# Patient Record
Sex: Female | Born: 1995 | Race: Black or African American | Hispanic: No | Marital: Single | Smoking: Former smoker
Health system: Southern US, Community
[De-identification: ages and names within clinical notes are randomized; demographics above are authoritative.]

## PROBLEM LIST (undated history)

## (undated) DIAGNOSIS — I10 Essential (primary) hypertension: Secondary | ICD-10-CM

## (undated) DIAGNOSIS — O1410 Severe pre-eclampsia, unspecified trimester: Secondary | ICD-10-CM

## (undated) DIAGNOSIS — R7989 Other specified abnormal findings of blood chemistry: Secondary | ICD-10-CM

## (undated) HISTORY — DX: Severe pre-eclampsia, unspecified trimester: O14.10

---

## 2017-08-29 ENCOUNTER — Encounter: Payer: Self-pay | Admitting: Emergency Medicine

## 2017-08-29 ENCOUNTER — Emergency Department
Admission: EM | Admit: 2017-08-29 | Discharge: 2017-08-29 | Disposition: A | Discharge status: Home / Self Care | Payer: Medicaid Other | Attending: Emergency Medicine | Admitting: Emergency Medicine

## 2017-08-29 DIAGNOSIS — Z3A00 Weeks of gestation of pregnancy not specified: Secondary | ICD-10-CM | POA: Diagnosis not present

## 2017-08-29 DIAGNOSIS — O219 Vomiting of pregnancy, unspecified: Secondary | ICD-10-CM | POA: Diagnosis not present

## 2017-08-29 DIAGNOSIS — Z3201 Encounter for pregnancy test, result positive: Secondary | ICD-10-CM | POA: Diagnosis not present

## 2017-08-29 DIAGNOSIS — R112 Nausea with vomiting, unspecified: Secondary | ICD-10-CM | POA: Diagnosis present

## 2017-08-29 LAB — URINALYSIS, ROUTINE W REFLEX MICROSCOPIC
BACTERIA UA: NONE SEEN
Bilirubin Urine: NEGATIVE
GLUCOSE, UA: NEGATIVE mg/dL
HGB URINE DIPSTICK: NEGATIVE
KETONES UR: NEGATIVE mg/dL
Leukocytes, UA: NEGATIVE
Nitrite: NEGATIVE
PROTEIN: 30 mg/dL — AB
Specific Gravity, Urine: 1.026 (ref 1.005–1.030)
pH: 5 (ref 5.0–8.0)

## 2017-08-29 LAB — POCT PREGNANCY, URINE: Preg Test, Ur: POSITIVE — AB

## 2017-08-29 MED ORDER — ONDANSETRON 4 MG PO TBDP: 4.0000 mg | ORAL_TABLET | Freq: Three times a day (TID) | ORAL | 0 refills | Status: DC | PRN

## 2017-08-29 MED ORDER — SODIUM CHLORIDE 0.9 % IV BOLUS (SEPSIS)
1000.0000 mL | Freq: Once | INTRAVENOUS | Status: DC
Start: 2017-08-29 — End: 2017-08-29

## 2017-08-29 MED ORDER — PRENATAL 1 30-0.975-200 MG PO CAPS: 30.0000 | ORAL_CAPSULE | Freq: Every day | ORAL | 2 refills | Status: AC

## 2017-08-29 NOTE — ED Provider Notes (Signed)
[facility] Emergency Department Provider Note  Time seen: 2:02 PM  I have reviewed the triage vital signs and the nursing notes.   HISTORY  Chief Complaint Emesis    HPI Tamara Krause is a 21 y.o. female Patient presents to the emergency department for intermittent nausea and vomiting over the past 3 weeks. Patient also states her last period was July 18. Denies any abdominal pain, vaginal bleeding or discharge. States mild nausea currently.  History reviewed. No pertinent past medical history.  There are no active problems to display for this patient.   History reviewed. No pertinent surgical history.  Prior to Admission medications   Not on File    No Known Allergies  No family history on file.  Social History Social History  Substance Use Topics  . Smoking status: Never Smoker  . Smokeless tobacco: Never Used  . Alcohol use No    Review of Systems Constitutional: Negative for fever. Cardiovascular: Negative for chest pain. Respiratory: Negative for shortness of breath. Gastrointestinal: Negative for abdominal pain Genitourinary: Negative for dysuria.negative for vaginal bleeding or discharge. Musculoskeletal: Negative for back pain. All other ROS negative  ____________________________________________   PHYSICAL EXAM:  VITAL SIGNS: ED Triage Vitals  Enc Vitals Group     BP 08/29/17 1310 (!) 145/97     Pulse Rate 08/29/17 1310 (!) 102     Resp 08/29/17 1310 16     Temp 08/29/17 1310 97.9 F (36.6 C)     Temp Source 08/29/17 1310 Oral     SpO2 08/29/17 1310 97 %     Weight 08/29/17 1312 200 lb (90.7 kg)     Height --      Head Circumference --      Peak Flow --      Pain Score --      Pain Loc --      Pain Edu? --      Excl. in GC? --     Constitutional: Alert and oriented. Well appearing and in no distress. Eyes: Normal exam ENT   Head: Normocephalic and atraumatic.   Mouth/Throat: Mucous membranes are  moist. Cardiovascular: Normal rate, regular rhythm. No murmur Respiratory: Normal respiratory effort without tachypnea nor retractions. Breath sounds are clear  Gastrointestinal: Soft and nontender. No distention.   Neurologic:  Normal speech and language. No gross focal neurologic deficits Skin:  Skin is warm, dry and intact.  Psychiatric: Mood and affect are normal.   ____________________________________________   INITIAL IMPRESSION / ASSESSMENT AND PLAN / ED COURSE  Pertinent labs & imaging results that were available during my care of the patient were reviewed by me and considered in my medical decision making (see chart for details).  patient's urine pregnancy test is positive. Urinalysis otherwise negative. Patient has no abdominal pain, nontender exam. I discussed with the patient follow-up with an OB/GYN. Given her significant nausea we will write for Zofran. I discussion with the patient regarding cleft lip/cleft palate risk, she still wishes to use the medication.  overall the patient appears very well. We will prescribe a prenatal vitamin. I discussed with the patient basic prenatal care such as over-the-counter medications.  ____________________________________________   FINAL CLINICAL IMPRESSION(S) / ED DIAGNOSES  pregnancy    Minna AntisPaduchowski, Mordche Hedglin, MD 08/29/17 [phone]

## 2017-08-29 NOTE — ED Triage Notes (Signed)
Pt to ED c/o N/V x 3 days. Pt states that she has vomited 6 times in the last 24 hours. Pt is drinking water in triage.

## 2017-08-29 NOTE — ED Notes (Signed)
FIRST NURSE NOTE: Pt c/o feeling very sleepy and vomiting. Pt arrived via POV.

## 2018-05-05 ENCOUNTER — Emergency Department
Admission: EM | Admit: 2018-05-05 | Discharge: 2018-05-05 | Disposition: A | Discharge status: Home / Self Care | Payer: Medicaid Other | Attending: Emergency Medicine | Admitting: Emergency Medicine

## 2018-05-05 ENCOUNTER — Other Ambulatory Visit: Payer: Self-pay

## 2018-05-05 ENCOUNTER — Encounter: Payer: Self-pay | Admitting: Emergency Medicine

## 2018-05-05 DIAGNOSIS — Z3490 Encounter for supervision of normal pregnancy, unspecified, unspecified trimester: Secondary | ICD-10-CM

## 2018-05-05 DIAGNOSIS — Z3201 Encounter for pregnancy test, result positive: Secondary | ICD-10-CM | POA: Insufficient documentation

## 2018-05-05 DIAGNOSIS — Z3A00 Weeks of gestation of pregnancy not specified: Secondary | ICD-10-CM | POA: Insufficient documentation

## 2018-05-05 DIAGNOSIS — Z87891 Personal history of nicotine dependence: Secondary | ICD-10-CM | POA: Diagnosis not present

## 2018-05-05 DIAGNOSIS — O210 Mild hyperemesis gravidarum: Secondary | ICD-10-CM | POA: Insufficient documentation

## 2018-05-05 DIAGNOSIS — O219 Vomiting of pregnancy, unspecified: Secondary | ICD-10-CM | POA: Diagnosis present

## 2018-05-05 LAB — URINALYSIS, COMPLETE (UACMP) WITH MICROSCOPIC
Bacteria, UA: NONE SEEN
Glucose, UA: NEGATIVE mg/dL
HGB URINE DIPSTICK: NEGATIVE
Ketones, ur: 20 mg/dL — AB
Nitrite: NEGATIVE
PH: 8 (ref 5.0–8.0)
Protein, ur: 100 mg/dL — AB
SPECIFIC GRAVITY, URINE: 1.029 (ref 1.005–1.030)

## 2018-05-05 LAB — COMPREHENSIVE METABOLIC PANEL
ALK PHOS: 40 U/L (ref 38–126)
ALT: 32 U/L (ref 14–54)
AST: 23 U/L (ref 15–41)
Albumin: 3.7 g/dL (ref 3.5–5.0)
Anion gap: 5 (ref 5–15)
BILIRUBIN TOTAL: 0.6 mg/dL (ref 0.3–1.2)
CALCIUM: 8.8 mg/dL — AB (ref 8.9–10.3)
CHLORIDE: 104 mmol/L (ref 101–111)
CO2: 26 mmol/L (ref 22–32)
CREATININE: 0.57 mg/dL (ref 0.44–1.00)
Glucose, Bld: 90 mg/dL (ref 65–99)
Potassium: 2.9 mmol/L — ABNORMAL LOW (ref 3.5–5.1)
Sodium: 135 mmol/L (ref 135–145)
TOTAL PROTEIN: 7 g/dL (ref 6.5–8.1)

## 2018-05-05 LAB — CBC
HCT: 34.6 % — ABNORMAL LOW (ref 35.0–47.0)
Hemoglobin: 12.1 g/dL (ref 12.0–16.0)
MCH: 27.7 pg (ref 26.0–34.0)
MCHC: 34.9 g/dL (ref 32.0–36.0)
MCV: 79.6 fL — AB (ref 80.0–100.0)
PLATELETS: 286 10*3/uL (ref 150–440)
RBC: 4.35 MIL/uL (ref 3.80–5.20)
RDW: 14.3 % (ref 11.5–14.5)
WBC: 4.7 10*3/uL (ref 3.6–11.0)

## 2018-05-05 LAB — LIPASE, BLOOD: Lipase: 32 U/L (ref 11–51)

## 2018-05-05 LAB — POCT PREGNANCY, URINE: Preg Test, Ur: POSITIVE — AB

## 2018-05-05 MED ORDER — METOCLOPRAMIDE HCL 10 MG PO TABS: 10.0000 mg | ORAL_TABLET | Freq: Three times a day (TID) | ORAL | 0 refills | Status: DC | PRN

## 2018-05-05 NOTE — ED Notes (Signed)
Patient denies pain and is resting comfortably.  

## 2018-05-05 NOTE — ED Triage Notes (Signed)
Pt in via POV with complaints of N/V x approximately 3 weeks.  Pt denies any abdominal pain, denies diarrhea.  Vitals WDL, NAD noted at this time.

## 2018-05-05 NOTE — Discharge Instructions (Signed)
Please seek medical attention for any high fevers, chest pain, shortness of breath, change in behavior, persistent vomiting, bloody stool or any other new or concerning symptoms.  

## 2018-05-05 NOTE — ED Provider Notes (Signed)
[facility] Emergency Department Provider Note  ____________________________________________   I have reviewed the triage vital signs and the nursing notes.   HISTORY  Chief Complaint Emesis   History limited by: Not Limited   HPI Tamara Krause is a 22 y.o. female who presents to the emergency department today because of concerns for nausea and vomiting.  She states this been going on for the past few weeks.  Happens in the morning.  She wonders if she is pregnant.  When asked if they done a pregnancy test the partner states that they had and it was positive however she appears to deny this.  Either way she has not started prenatal vitamins.  She denies any abdominal pain.  Denies any vaginal bleeding.   Per medical record review patient has a history of vaginal bleeding in the passed and missed abortion in the past.   History reviewed. No pertinent past medical history.  There are no active problems to display for this patient.   History reviewed. No pertinent surgical history.  Prior to Admission medications   Medication Sig Start Date End Date Taking? Authorizing Provider  ondansetron (ZOFRAN ODT) 4 MG disintegrating tablet Take 1 tablet (4 mg total) by mouth every 8 (eight) hours as needed for nausea or vomiting. 08/29/17   Minna Antis, MD    Allergies Patient has no known allergies.  No family history on file.  Social History Social History   Tobacco Use  . Smoking status: Former Games developer  . Smokeless tobacco: Never Used  Substance Use Topics  . Alcohol use: No  . Drug use: No    Review of Systems Constitutional: No fever/chills Eyes: No visual changes. ENT: No sore throat. Cardiovascular: Denies chest pain. Respiratory: Denies shortness of breath. Gastrointestinal: No abdominal pain.  Positive for nausea and vomiting. Genitourinary: Negative for dysuria. Musculoskeletal: Negative for back pain. Skin: Negative for  rash. Neurological: Negative for headaches, focal weakness or numbness.  ____________________________________________   PHYSICAL EXAM:  VITAL SIGNS: ED Triage Vitals  Enc Vitals Group     BP 05/05/18 1423 127/65     Pulse Rate 05/05/18 1423 90     Resp 05/05/18 1423 16     Temp 05/05/18 1423 98.8 F (37.1 C)     Temp Source 05/05/18 1423 Oral     SpO2 05/05/18 1423 100 %     Weight 05/05/18 1424 180 lb (81.6 kg)     Height 05/05/18 1424  (1.626 m)     Head Circumference --      Peak Flow --      Pain Score 05/05/18 1424 0   Constitutional: Alert and oriented. Well appearing and in no distress. Eyes: Conjunctivae are normal.  ENT   Head: Normocephalic and atraumatic.   Nose: No congestion/rhinnorhea.   Mouth/Throat: Mucous membranes are moist.   Neck: No stridor. Cardiovascular: Normal rate, regular rhythm.  No murmurs, rubs, or gallops.  Respiratory: Normal respiratory effort without tachypnea nor retractions. Breath sounds are clear and equal bilaterally. No wheezes/rales/rhonchi. Gastrointestinal: Soft and non tender. No rebound. No guarding.  Genitourinary: Deferred Musculoskeletal: Normal range of motion in all extremities. No lower extremity edema. Neurologic:  Normal speech and language. No gross focal neurologic deficits are appreciated.  Skin:  Skin is warm, dry and intact. No rash noted. Psychiatric: Mood and affect are normal. Speech and behavior are normal. Patient exhibits appropriate insight and judgment.  ____________________________________________    LABS (pertinent positives/negatives)  Upreg positive  Lipase 32 CMP na 135, k 2.9, glu 90, cr 0.57 CBC wbc 4.7, hgb 12.1, plt 286 UA cloudy, trace leukocytes, 6-10 wbcs, 21-50 squaomous ____________________________________________   EKG  None  ____________________________________________     RADIOLOGY  None  ____________________________________________   PROCEDURES  Procedures  ____________________________________________   INITIAL IMPRESSION / ASSESSMENT AND PLAN / ED COURSE  Pertinent labs & imaging results that were available during my care of the patient were reviewed by me and considered in my medical decision making (see chart for details).  Patient presented to the emergency department today because of concerns for nausea and vomiting possible pregnancy.  Urinary pregnancy test was positive today.  Had a long discussion with the patient about importance of testing if she thinks she is pregnant in the future.  Discussed importance of prenatal vitamins.  Will plan on discharging with antiemetics.  Given lack of abdominal pain, tenderness or vaginal bleeding do not feel any emergent ultrasound is warranted at this time.  Discussed with patient importance of following up with OB/GYN to establish prenatal care.   ____________________________________________   FINAL CLINICAL IMPRESSION(S) / ED DIAGNOSES  Final diagnoses:  Morning sickness  Pregnancy, unspecified gestational age     Note: This dictation was prepared with Nurse, children's dictation. Any transcriptional errors that result from this process are unintentional     Phineas Semen, MD 05/05/18 [phone]

## 2018-05-06 LAB — URINE CULTURE

## 2019-11-17 ENCOUNTER — Encounter: Payer: Self-pay | Admitting: Emergency Medicine

## 2019-11-17 ENCOUNTER — Emergency Department
Admission: EM | Admit: 2019-11-17 | Discharge: 2019-11-17 | Disposition: A | Discharge status: Home / Self Care | Payer: Medicaid Other | Attending: Emergency Medicine | Admitting: Emergency Medicine

## 2019-11-17 ENCOUNTER — Other Ambulatory Visit: Payer: Self-pay

## 2019-11-17 DIAGNOSIS — K029 Dental caries, unspecified: Secondary | ICD-10-CM | POA: Insufficient documentation

## 2019-11-17 DIAGNOSIS — Z87891 Personal history of nicotine dependence: Secondary | ICD-10-CM | POA: Insufficient documentation

## 2019-11-17 DIAGNOSIS — K0889 Other specified disorders of teeth and supporting structures: Secondary | ICD-10-CM | POA: Diagnosis present

## 2019-11-17 DIAGNOSIS — K047 Periapical abscess without sinus: Secondary | ICD-10-CM | POA: Insufficient documentation

## 2019-11-17 MED ORDER — MAGIC MOUTHWASH W/LIDOCAINE: 5.0000 mL | Freq: Four times a day (QID) | ORAL | 0 refills | Status: DC

## 2019-11-17 MED ORDER — AMOXICILLIN 875 MG PO TABS: 875.0000 mg | ORAL_TABLET | Freq: Two times a day (BID) | ORAL | 0 refills | Status: DC

## 2019-11-17 NOTE — ED Triage Notes (Signed)
First RN Note: Pt presents to ED via POV with c/o dental pain, pt states initially one sided now hurts on both sides, and radiates into her ears. Pt A&O x4, NAD noted at this time.

## 2019-11-17 NOTE — Discharge Instructions (Signed)
OPTIONS FOR DENTAL FOLLOW UP CARE ° °[place] Department of Health and Human Services - Local Safety Net Dental Clinics °http://www.ncdhhs.gov/dph/oralhealth/services/safetynetclinics.htm °  °[facility] (336-562-3123) ° °[facility] [place] ([phone]) ° °[facility] ([phone] ext [phone]) ° °[facility] ([phone]) ° °[facility] ([phone]) °This clinic caters to the indigent population and is on a lottery system. °Location: °[facility], [address], [place] °Clinic Hours: °Wednesdays from 6pm - 9pm, patients seen by a lottery system. °For dates, call or go to www.med.[facility].edu/[facility]/patients/Dental-[facility] °Services: °Cleanings, fillings and simple extractions. °Payment Options: °DENTAL WORK IS FREE OF CHARGE. Bring proof of income or support. °Best way to get seen: °Arrive at 5:15 pm - this is a lottery, NOT first come/first serve, so arriving earlier will not increase your chances of being seen. °  °  °[facility] °[phone] °Select option 1 for emergencies °  °Location: °[facility], [address], [place] °Clinic Hours: °No walk-ins accepted - call the day before to schedule an appointment. °Check in times are 9:30 am and 1:30 pm. °Services: °Simple extractions, temporary fillings, pulpectomy/pulp debridement, uncomplicated abscess drainage. °Payment Options: °PAYMENT IS DUE AT THE TIME OF SERVICE.  Fee is usually $100-200, additional surgical procedures (e.g. abscess drainage) may be extra. °Cash, checks, Visa/MasterCard accepted.  Can file Medicaid if patient is covered for dental - patient should call case worker to check. °No discount for [facility] patients. °Best way to get seen: °MUST call the day before and get onto the schedule. Can usually be seen the next 1-2 days. No walk-ins accepted. °  °  °[facility] °[phone] °   °Location: °[facility], [address], [place] °Clinic Hours: °M, W, Th, F 8am or 1:30pm, Tues 9a or 1:30 - first come/first served. °Services: °Simple extractions, temporary fillings, uncomplicated abscess drainage.  You do not need to be an [place] resident. °Payment Options: °PAYMENT IS DUE AT THE TIME OF SERVICE. °Dental insurance, otherwise sliding scale - bring proof of income or support. °Depending on income and treatment needed, cost is usually $50-200. °Best way to get seen: °Arrive early as it is first come/first served. °  °  °[facility] °[phone] °  °Location: °[address] °Clinic Hours: °Mon-Thu 8a-5p °Services: °Most basic dental services including extractions and fillings. °Payment Options: °PAYMENT IS DUE AT THE TIME OF SERVICE. °Sliding scale, up to 50% off - bring proof if income or support. °Medicaid with dental option accepted. °Best way to get seen: °Call to schedule an appointment, can usually be seen within 2 weeks OR they will try to see walk-ins - show up at 8a or 2p (you may have to wait). °  °  °[facility] °919-245-2435 °[place] RESIDENTS ONLY °  °Location: °[facility], [address], [place],  [address] °Clinic Hours: By appointment only. °Monday - Thursday 8am-5pm, Friday 8am-12pm °Services: Cleanings, fillings, extractions. °Payment Options: °PAYMENT IS DUE AT THE TIME OF SERVICE. °Cash, Visa or MasterCard. Sliding scale - $30 minimum per service. °Best way to get seen: °Come in to office, complete packet and make an appointment - need proof of income °or support monies for each household member and proof of [place] residence. °Usually takes about a month to get in. °  °  °[facility] °[phone] °  °Location: °[address]   [place] °Clinic Hours: Walk-in Urgent Care Dental Services are offered Monday-Friday  mornings only. °The numbers of emergencies accepted daily is limited to the number of °providers available. °Maximum 15 - Mondays, Wednesdays & Thursdays °Maximum 10 - Tuesdays & Fridays °Services: °You do not need to be a [place] resident to be seen for a dental emergency. °Emergencies are defined as pain, swelling, abnormal bleeding, or dental trauma. Walkins will receive x-rays if needed. °NOTE: Dental cleaning is not an emergency. °Payment Options: °PAYMENT IS DUE AT THE TIME OF SERVICE. °Minimum co-pay is $40.00 for uninsured patients. °Minimum co-pay is $3.00 for Medicaid with dental coverage. °Dental Insurance is accepted and must be presented at time of visit. °Medicare does not cover dental. °Forms of payment: Cash, credit card, checks. °Best way to get seen: °If not previously registered with the clinic, walk-in dental registration begins at 7:15 am and is on a first come/first serve basis. °If previously registered with the clinic, call to make an appointment. °  °  °[facility] °[phone] °[place] RESIDENTS ONLY °  °Location: °[address], [place], [place] °Clinic Hours: °Mon-Thu 10a-2p °Services: Extractions only! °Payment Options: °FREE (donations accepted) - bring proof of income or support °Best way to get seen: °Call and schedule an appointment OR come at 8am on the 1st Monday of every month (except for holidays) when it is first come/first served. °  °  °[facility] °[phone] °  °Location: °[address], [place] °Clinic Hours: °Friday mornings °Services, Payment Options, Best way to get seen: °Call for info °

## 2019-11-17 NOTE — ED Notes (Signed)
See triage note presents with dental pain  States she has pain to both lower gumlines  States pain is moving into ears

## 2019-11-17 NOTE — ED Provider Notes (Signed)
[facility] Emergency Department Provider Note  ____________________________________________  Time seen: Approximately 6:02 PM  I have reviewed the triage vital signs and the nursing notes.   HISTORY  Chief Complaint Dental Pain    HPI Tamara Krause is a 23 y.o. female who presents the emergency department for evaluation of bilateral lower dental pain.  Patient reports the pain began on the right side, now encompasses the left side.  Patient has had no reported oropharyngeal swelling.  No fevers or chills, difficulty breathing or swallowing.  Patient reports that sometimes the pain radiates into her ears.  Patient denies any headache, URI symptoms, chest pain, shortness of breath abdominal pain, nausea vomiting.  Patient is tried intermittent Tylenol or Motrin with no significant relief of symptoms.  Patient does not have a dentist.         History reviewed. No pertinent past medical history.  There are no active problems to display for this patient.   History reviewed. No pertinent surgical history.  Prior to Admission medications   Medication Sig Start Date End Date Taking? Authorizing Provider  amoxicillin (AMOXIL) 875 MG tablet Take 1 tablet (875 mg total) by mouth 2 (two) times daily. 11/17/19   Cuthriell, Delorise Royals, PA-C  magic mouthwash w/lidocaine SOLN Take 5 mLs by mouth 4 (four) times daily. 11/17/19   Cuthriell, Delorise Royals, PA-C    Allergies Patient has no known allergies.  No family history on file.  Social History Social History   Tobacco Use  . Smoking status: Former Games developer  . Smokeless tobacco: Never Used  Substance Use Topics  . Alcohol use: No  . Drug use: No     Review of Systems  Constitutional: No fever/chills Eyes: No visual changes. No discharge ENT: Positive for bilateral lower dental pain Cardiovascular: no chest pain. Respiratory: no cough. No SOB. Gastrointestinal: No abdominal pain.  No nausea, no vomiting.   No diarrhea.  No constipation. Musculoskeletal: Negative for musculoskeletal pain. Skin: Negative for rash, abrasions, lacerations, ecchymosis. Neurological: Negative for headaches, focal weakness or numbness. 10-point ROS otherwise negative.  ____________________________________________   PHYSICAL EXAM:  VITAL SIGNS: ED Triage Vitals  Enc Vitals Group     BP 11/17/19 1714 (!) 152/100     Pulse Rate 11/17/19 1714 91     Resp 11/17/19 1714 18     Temp 11/17/19 1714 98.3 F (36.8 C)     Temp Source 11/17/19 1714 Oral     SpO2 11/17/19 1714 100 %     Weight 11/17/19 1723 179 lb 14.3 oz (81.6 kg)     Height --      Head Circumference --      Peak Flow --      Pain Score 11/17/19 1723 8     Pain Loc --      Pain Edu? --      Excl. in GC? --      Constitutional: Alert and oriented. Well appearing and in no acute distress. Eyes: Conjunctivae are normal. PERRL. EOMI. Head: Atraumatic. ENT:      Ears:       Nose: No congestion/rhinnorhea.      Mouth/Throat: Mucous membranes are moist.  Evaluation of the oropharyngeal cavity reveals multiple dental erosions, 2 areas to both lower dentition that have eroded to the gumline.  No gross erythema or edema.  No purulent drainage.  Uvula is midline.  Sublingual space is soft. Neck: No stridor.  Neck is supple full range of motion  Hematological/Lymphatic/Immunilogical: No cervical lymphadenopathy. Cardiovascular: Normal rate, regular rhythm. Normal S1 and S2.  Good peripheral circulation. Respiratory: Normal respiratory effort without tachypnea or retractions. Lungs CTAB. Good air entry to the bases with no decreased or absent breath sounds. Musculoskeletal: Full range of motion to all extremities. No gross deformities appreciated. Neurologic:  Normal speech and language. No gross focal neurologic deficits are appreciated.  Skin:  Skin is warm, dry and intact. No rash noted. Psychiatric: Mood and affect are normal. Speech and behavior are  normal. Patient exhibits appropriate insight and judgement.   ____________________________________________   LABS (all labs ordered are listed, but only abnormal results are displayed)  Labs Reviewed - No data to display ____________________________________________  EKG   ____________________________________________  RADIOLOGY   No results found.  ____________________________________________    PROCEDURES  Procedure(s) performed:    Procedures    Medications - No data to display   ____________________________________________   INITIAL IMPRESSION / ASSESSMENT AND PLAN / ED COURSE  Pertinent labs & imaging results that were available during my care of the patient were reviewed by me and considered in my medical decision making (see chart for details).  Review of the [place] CSRS was performed in accordance of the [place] prior to dispensing any controlled drugs.           Patient's diagnosis is consistent with dental caries, dental pain.  Patient presented to emergency department with bilateral lower dental pain.  Patient has areas to bilateral lower dentition that have eroded to the gumline.  Patient does not have a dentist.  No gross oropharyngeal edema or erythema.  No evidence of deep space infection.. Patient will be discharged home with prescriptions for magic mouthwash and amoxicillin. Patient is to follow up with dentist as needed or otherwise directed. Patient is given ED precautions to return to the ED for any worsening or new symptoms.     ____________________________________________  FINAL CLINICAL IMPRESSION(S) / ED DIAGNOSES  Final diagnoses:  Dental caries  Dental infection  Pain, dental      NEW MEDICATIONS STARTED DURING THIS VISIT:  ED Discharge Orders         Ordered    magic mouthwash w/lidocaine SOLN  4 times daily    Note to Pharmacy: Dispense in a 1/1/1 ratio. Use lidocaine, diphenhydramine, prednisolone   11/17/19 1824     amoxicillin (AMOXIL) 875 MG tablet  2 times daily     11/17/19 1824              This chart was dictated using voice recognition software/Dragon. Despite best efforts to proofread, errors can occur which can change the meaning. Any change was purely unintentional.    Darletta Moll, PA-C 11/17/19 Gertha Calkin, MD 11/17/19 2022

## 2019-11-17 NOTE — ED Triage Notes (Signed)
C/O dental pain x 1 week.  Now c/o facial pain as well.  AAOx3.  Skin warm and dry. NAD

## 2019-12-11 ENCOUNTER — Emergency Department
Admission: EM | Admit: 2019-12-11 | Discharge: 2019-12-11 | Disposition: A | Discharge status: Home / Self Care | Payer: Medicaid Other | Attending: Emergency Medicine | Admitting: Emergency Medicine

## 2019-12-11 ENCOUNTER — Encounter: Payer: Self-pay | Admitting: Emergency Medicine

## 2019-12-11 ENCOUNTER — Other Ambulatory Visit: Payer: Self-pay

## 2019-12-11 ENCOUNTER — Emergency Department: Payer: Medicaid Other

## 2019-12-11 DIAGNOSIS — O211 Hyperemesis gravidarum with metabolic disturbance: Secondary | ICD-10-CM | POA: Insufficient documentation

## 2019-12-11 DIAGNOSIS — O210 Mild hyperemesis gravidarum: Secondary | ICD-10-CM

## 2019-12-11 DIAGNOSIS — Z3A08 8 weeks gestation of pregnancy: Secondary | ICD-10-CM | POA: Diagnosis not present

## 2019-12-11 DIAGNOSIS — R112 Nausea with vomiting, unspecified: Secondary | ICD-10-CM | POA: Diagnosis present

## 2019-12-11 DIAGNOSIS — Z87891 Personal history of nicotine dependence: Secondary | ICD-10-CM | POA: Diagnosis not present

## 2019-12-11 LAB — URINALYSIS, COMPLETE (UACMP) WITH MICROSCOPIC
Bilirubin Urine: NEGATIVE
Glucose, UA: NEGATIVE mg/dL
Hgb urine dipstick: NEGATIVE
Ketones, ur: 20 mg/dL — AB
Nitrite: NEGATIVE
Protein, ur: >=300 mg/dL — AB
Specific Gravity, Urine: 1.028 (ref 1.005–1.030)
pH: 5 (ref 5.0–8.0)

## 2019-12-11 LAB — CBC WITH DIFFERENTIAL/PLATELET
Abs Immature Granulocytes: 0.01 10*3/uL (ref 0.00–0.07)
Basophils Absolute: 0 10*3/uL (ref 0.0–0.1)
Basophils Relative: 1 %
Eosinophils Absolute: 0 10*3/uL (ref 0.0–0.5)
Eosinophils Relative: 0 %
HCT: 40.7 % (ref 36.0–46.0)
Hemoglobin: 15.1 g/dL — ABNORMAL HIGH (ref 12.0–15.0)
Immature Granulocytes: 0 %
Lymphocytes Relative: 47 %
Lymphs Abs: 2.9 10*3/uL (ref 0.7–4.0)
MCH: 27.3 pg (ref 26.0–34.0)
MCHC: 37.1 g/dL — ABNORMAL HIGH (ref 30.0–36.0)
MCV: 73.6 fL — ABNORMAL LOW (ref 80.0–100.0)
Monocytes Absolute: 0.6 10*3/uL (ref 0.1–1.0)
Monocytes Relative: 10 %
Neutro Abs: 2.6 10*3/uL (ref 1.7–7.7)
Neutrophils Relative %: 42 %
Platelets: 270 10*3/uL (ref 150–400)
RBC: 5.53 MIL/uL — ABNORMAL HIGH (ref 3.87–5.11)
RDW: 12.8 % (ref 11.5–15.5)
WBC: 6.2 10*3/uL (ref 4.0–10.5)
nRBC: 0 % (ref 0.0–0.2)

## 2019-12-11 LAB — COMPREHENSIVE METABOLIC PANEL
ALT: 57 U/L — ABNORMAL HIGH (ref 0–44)
AST: 40 U/L (ref 15–41)
Albumin: 5.2 g/dL — ABNORMAL HIGH (ref 3.5–5.0)
Alkaline Phosphatase: 56 U/L (ref 38–126)
Anion gap: 18 — ABNORMAL HIGH (ref 5–15)
BUN: 17 mg/dL (ref 6–20)
CO2: 22 mmol/L (ref 22–32)
Calcium: 10.2 mg/dL (ref 8.9–10.3)
Chloride: 94 mmol/L — ABNORMAL LOW (ref 98–111)
Creatinine, Ser: 0.82 mg/dL (ref 0.44–1.00)
GFR calc Af Amer: >60 mL/min (ref >60)
GFR calc non Af Amer: >60 mL/min (ref >60)
Glucose, Bld: 107 mg/dL — ABNORMAL HIGH (ref 70–99)
Potassium: 3 mmol/L — ABNORMAL LOW (ref 3.5–5.1)
Sodium: 134 mmol/L — ABNORMAL LOW (ref 135–145)
Total Bilirubin: 2.3 mg/dL — ABNORMAL HIGH (ref 0.3–1.2)
Total Protein: 9.5 g/dL — ABNORMAL HIGH (ref 6.5–8.1)

## 2019-12-11 LAB — HCG, QUANTITATIVE, PREGNANCY: hCG, Beta Chain, Quant, S: 146035 m[IU]/mL — ABNORMAL HIGH (ref <5)

## 2019-12-11 LAB — LIPASE, BLOOD: Lipase: 34 U/L (ref 11–51)

## 2019-12-11 LAB — POCT PREGNANCY, URINE: Preg Test, Ur: POSITIVE — AB

## 2019-12-11 MED ORDER — PROMETHAZINE HCL 12.5 MG PO TABS: 12.5000 mg | ORAL_TABLET | Freq: Four times a day (QID) | ORAL | 0 refills | Status: DC | PRN

## 2019-12-11 MED ORDER — LACTATED RINGERS IV BOLUS
1000.0000 mL | Freq: Once | INTRAVENOUS | Status: AC
  Administered 2019-12-11: 1000 mL via INTRAVENOUS

## 2019-12-11 MED ORDER — POTASSIUM CHLORIDE CRYS ER 20 MEQ PO TBCR
40.0000 meq | EXTENDED_RELEASE_TABLET | Freq: Once | ORAL | Status: AC
  Administered 2019-12-11: 40 meq via ORAL
  Filled 2019-12-11: qty 2

## 2019-12-11 MED ORDER — SODIUM CHLORIDE 0.9% FLUSH: 3.0000 mL | Freq: Once | INTRAVENOUS | Status: DC

## 2019-12-11 MED ORDER — SODIUM CHLORIDE 0.9 % IV BOLUS
1000.0000 mL | Freq: Once | INTRAVENOUS | Status: AC
  Administered 2019-12-11: 1000 mL via INTRAVENOUS

## 2019-12-11 MED ORDER — ONDANSETRON HCL 4 MG/2ML IJ SOLN
4.0000 mg | Freq: Once | INTRAMUSCULAR | Status: AC
  Administered 2019-12-11: 4 mg via INTRAVENOUS
  Filled 2019-12-11: qty 2

## 2019-12-11 NOTE — ED Notes (Signed)
Pt requests something to drink. Pt given ice chips.

## 2019-12-11 NOTE — ED Notes (Signed)
Patient transported to Ultrasound 

## 2019-12-11 NOTE — ED Provider Notes (Signed)
[facility] Emergency Department Provider Note   ____________________________________________   First MD Initiated Contact with Patient 12/11/19 1442     (approximate)  I have reviewed the triage vital signs and the nursing notes.   HISTORY  Chief Complaint Emesis    HPI Tamara Krause is a 23 y.o. female with no significant past medical history presents to the ED complaining of nausea and vomiting.  Patient reports 1 week of persistent nausea and vomiting, about 4-5 episodes per day.  She states she has been unable to keep down any solids or liquids due to these episodes, but she has not noticed any blood in her vomit.  She denies any diarrhea, abdominal pain, dysuria, or hematuria.  She does state that she had similar difficulties with nausea and vomiting during a prior pregnancy and her LMP was in the middle of October.  She denies any abdominal pain, vaginal bleeding, or vaginal discharge.        History reviewed. No pertinent past medical history.  There are no problems to display for this patient.   Past Surgical History:  Procedure Laterality Date  . CESAREAN SECTION      Prior to Admission medications   Medication Sig Start Date End Date Taking? Authorizing Provider  amoxicillin (AMOXIL) 875 MG tablet Take 1 tablet (875 mg total) by mouth 2 (two) times daily. 11/17/19   Cuthriell, Delorise Royals, PA-C  magic mouthwash w/lidocaine SOLN Take 5 mLs by mouth 4 (four) times daily. 11/17/19   Cuthriell, Delorise Royals, PA-C  promethazine (PHENERGAN) 12.5 MG tablet Take 1 tablet (12.5 mg total) by mouth every 6 (six) hours as needed for nausea or vomiting. 12/11/19   Chesley Noon, MD    Allergies Patient has no known allergies.  No family history on file.  Social History Social History   Tobacco Use  . Smoking status: Former Games developer  . Smokeless tobacco: Never Used  Substance Use Topics  . Alcohol use: No  . Drug use: No    Review of  Systems  Constitutional: No fever/chills Eyes: No visual changes. ENT: No sore throat. Cardiovascular: Denies chest pain. Respiratory: Denies shortness of breath. Gastrointestinal: No abdominal pain.  Positive for nausea and vomiting.  No diarrhea.  No constipation. Genitourinary: Negative for dysuria. Musculoskeletal: Negative for back pain. Skin: Negative for rash. Neurological: Negative for headaches, focal weakness or numbness.  ____________________________________________   PHYSICAL EXAM:  VITAL SIGNS: ED Triage Vitals  Enc Vitals Group     BP 12/11/19 1432 (!) 57/32     Pulse Rate 12/11/19 1432 (!) 125     Resp 12/11/19 1432 16     Temp 12/11/19 1432 99.3 F (37.4 C)     Temp src --      SpO2 12/11/19 1432 99 %     Weight --      Height --      Head Circumference --      Peak Flow --      Pain Score 12/11/19 1433 0     Pain Loc --      Pain Edu? --      Excl. in GC? --     Constitutional: Alert and oriented. Eyes: Conjunctivae are normal. Head: Atraumatic. Nose: No congestion/rhinnorhea. Mouth/Throat: Mucous membranes are moist. Neck: Normal ROM Cardiovascular: Normal rate, regular rhythm. Grossly normal heart sounds. Respiratory: Normal respiratory effort.  No retractions. Lungs CTAB. Gastrointestinal: Soft and nontender. No distention. Genitourinary: deferred Musculoskeletal: No lower extremity tenderness  nor edema. Neurologic:  Normal speech and language. No gross focal neurologic deficits are appreciated. Skin:  Skin is warm, dry and intact. No rash noted. Psychiatric: Mood and affect are normal. Speech and behavior are normal.  ____________________________________________   LABS (all labs ordered are listed, but only abnormal results are displayed)  Labs Reviewed  COMPREHENSIVE METABOLIC PANEL - Abnormal; Notable for the following components:      Result Value   Sodium 134 (*)    Potassium 3.0 (*)    Chloride 94 (*)    Glucose, Bld 107 (*)     Total Protein 9.5 (*)    Albumin 5.2 (*)    ALT 57 (*)    Total Bilirubin 2.3 (*)    Anion gap 18 (*)    All other components within normal limits  URINALYSIS, COMPLETE (UACMP) WITH MICROSCOPIC - Abnormal; Notable for the following components:   Color, Urine AMBER (*)    APPearance CLOUDY (*)    Ketones, ur 20 (*)    Protein, ur >=300 (*)    Leukocytes,Ua SMALL (*)    Bacteria, UA FEW (*)    All other components within normal limits  CBC WITH DIFFERENTIAL/PLATELET - Abnormal; Notable for the following components:   RBC 5.53 (*)    Hemoglobin 15.1 (*)    MCV 73.6 (*)    MCHC 37.1 (*)    All other components within normal limits  HCG, QUANTITATIVE, PREGNANCY - Abnormal; Notable for the following components:   hCG, Beta Chain, Quant, S 146,035 (*)    All other components within normal limits  POCT PREGNANCY, URINE - Abnormal; Notable for the following components:   Preg Test, Ur POSITIVE (*)    All other components within normal limits  LIPASE, BLOOD  POC URINE PREG, ED    PROCEDURES  Procedure(s) performed (including Critical Care):  Procedures   ____________________________________________   INITIAL IMPRESSION / ASSESSMENT AND PLAN / ED COURSE       23 year old female presents to the ED for 1 week of nausea and vomiting with inability to tolerate p.o.,  Reports similar symptoms when she was last pregnant.  She denies any abdominal pain and has no tenderness noted on exam, also denies any vaginal symptoms.  Lab work is significant for hypokalemia as well as mild elevation in bilirubin, will screen right upper quadrant ultrasound for biliary pathology.  Lipase and remainder of LFTs are within normal limits.  Patient also with positive pregnancy testing here in the ED, will need to perform ultrasound to rule out potential ectopic pregnancy.  She does not have any symptoms to suggest UTI.  She reports feeling better following dose of Zofran, will hydrate with IV fluids and  reevaluate following ultrasounds.  Right upper quadrant ultrasound is unremarkable and obstetric ultrasound shows intrauterine pregnancy at approximately 8 weeks of gestation.  Patient without any further nausea or vomiting, has been tolerating p.o. here in the ED.  UA is not consistent with UTI.  Suspect her elevated bilirubin is secondary to vomiting, will have patient follow-up with OB/GYN and return to the ED for new or worsening symptoms.  She was prescribed Phenergan for use at home as this has worked for her previously.      ____________________________________________   FINAL CLINICAL IMPRESSION(S) / ED DIAGNOSES  Final diagnoses:  Nausea and vomiting  Hyperemesis gravidarum  [redacted] weeks gestation of pregnancy     ED Discharge Orders         Ordered  promethazine (PHENERGAN) 12.5 MG tablet  Every 6 hours PRN     12/11/19 1754           Note:  This document was prepared using Dragon voice recognition software and may include unintentional dictation errors.   Chesley NoonJessup, Jakita Dutkiewicz, MD 12/11/19 [phone]

## 2019-12-11 NOTE — ED Triage Notes (Signed)
Pt comes into the ED via EMS from home with c/o N/V for the past week, unsure of pregnancy. Pt also having panic attack.

## 2019-12-11 NOTE — ED Notes (Signed)
Pt given crackers and ginger ale OK per EDP. Pt also given warm blanket.

## 2019-12-11 NOTE — ED Triage Notes (Signed)
Pt to ED via [facility] from home for emesis x 1 week. Pt states that she is unsure if she is pregnant or not. Pt states that she has not been able to keep fluids down. Pt is tachycardiac in triage.

## 2020-01-14 ENCOUNTER — Encounter: Payer: Self-pay | Admitting: Emergency Medicine

## 2020-01-14 ENCOUNTER — Other Ambulatory Visit: Payer: Self-pay

## 2020-01-14 DIAGNOSIS — Z79899 Other long term (current) drug therapy: Secondary | ICD-10-CM | POA: Diagnosis not present

## 2020-01-14 DIAGNOSIS — O219 Vomiting of pregnancy, unspecified: Secondary | ICD-10-CM | POA: Diagnosis present

## 2020-01-14 DIAGNOSIS — O99281 Endocrine, nutritional and metabolic diseases complicating pregnancy, first trimester: Secondary | ICD-10-CM | POA: Insufficient documentation

## 2020-01-14 DIAGNOSIS — O210 Mild hyperemesis gravidarum: Secondary | ICD-10-CM | POA: Diagnosis not present

## 2020-01-14 DIAGNOSIS — Z3A10 10 weeks gestation of pregnancy: Secondary | ICD-10-CM | POA: Insufficient documentation

## 2020-01-14 DIAGNOSIS — Z87891 Personal history of nicotine dependence: Secondary | ICD-10-CM | POA: Diagnosis not present

## 2020-01-14 DIAGNOSIS — E876 Hypokalemia: Secondary | ICD-10-CM | POA: Diagnosis not present

## 2020-01-14 MED ORDER — ONDANSETRON HCL 4 MG/2ML IJ SOLN
4.0000 mg | Freq: Once | INTRAMUSCULAR | Status: AC | PRN
  Administered 2020-01-14: 22:00:00 4 mg via INTRAVENOUS
  Filled 2020-01-14: qty 2

## 2020-01-14 MED ORDER — SODIUM CHLORIDE 0.9 % IV BOLUS
1000.0000 mL | Freq: Once | INTRAVENOUS | Status: AC
  Administered 2020-01-14: 1000 mL via INTRAVENOUS

## 2020-01-14 NOTE — ED Triage Notes (Signed)
Pt is [redacted] weeks pregnant, has had nausea and vomiting for weeks that has worsened for a week. Pt actively vomiting and retching in triage. Denies diarrhea, fever, vaginal bleeding or discharge.

## 2020-01-15 ENCOUNTER — Emergency Department
Admission: EM | Admit: 2020-01-15 | Discharge: 2020-01-15 | Disposition: A | Discharge status: Home / Self Care | Payer: Medicaid Other | Attending: Emergency Medicine | Admitting: Emergency Medicine

## 2020-01-15 DIAGNOSIS — O210 Mild hyperemesis gravidarum: Secondary | ICD-10-CM

## 2020-01-15 DIAGNOSIS — E876 Hypokalemia: Secondary | ICD-10-CM

## 2020-01-15 LAB — CBC
HCT: 36.3 % (ref 36.0–46.0)
Hemoglobin: 13.1 g/dL (ref 12.0–15.0)
MCH: 27.1 pg (ref 26.0–34.0)
MCHC: 36.1 g/dL — ABNORMAL HIGH (ref 30.0–36.0)
MCV: 75.2 fL — ABNORMAL LOW (ref 80.0–100.0)
Platelets: 376 10*3/uL (ref 150–400)
RBC: 4.83 MIL/uL (ref 3.87–5.11)
RDW: 12.5 % (ref 11.5–15.5)
WBC: 6.2 10*3/uL (ref 4.0–10.5)
nRBC: 0 % (ref 0.0–0.2)

## 2020-01-15 LAB — COMPREHENSIVE METABOLIC PANEL
ALT: 49 U/L — ABNORMAL HIGH (ref 0–44)
AST: 30 U/L (ref 15–41)
Albumin: 4.2 g/dL (ref 3.5–5.0)
Alkaline Phosphatase: 56 U/L (ref 38–126)
Anion gap: 15 (ref 5–15)
BUN: 21 mg/dL — ABNORMAL HIGH (ref 6–20)
CO2: 25 mmol/L (ref 22–32)
Calcium: 9.1 mg/dL (ref 8.9–10.3)
Chloride: 93 mmol/L — ABNORMAL LOW (ref 98–111)
Creatinine, Ser: 0.75 mg/dL (ref 0.44–1.00)
GFR calc Af Amer: >60 mL/min (ref >60)
GFR calc non Af Amer: >60 mL/min (ref >60)
Glucose, Bld: 94 mg/dL (ref 70–99)
Potassium: 2.6 mmol/L — CL (ref 3.5–5.1)
Sodium: 133 mmol/L — ABNORMAL LOW (ref 135–145)
Total Bilirubin: 1.9 mg/dL — ABNORMAL HIGH (ref 0.3–1.2)
Total Protein: 8.1 g/dL (ref 6.5–8.1)

## 2020-01-15 LAB — URINALYSIS, COMPLETE (UACMP) WITH MICROSCOPIC
Bilirubin Urine: NEGATIVE
Glucose, UA: NEGATIVE mg/dL
Hgb urine dipstick: NEGATIVE
Ketones, ur: 20 mg/dL — AB
Nitrite: NEGATIVE
Protein, ur: 100 mg/dL — AB
Specific Gravity, Urine: 1.028 (ref 1.005–1.030)
pH: 5 (ref 5.0–8.0)

## 2020-01-15 MED ORDER — METOCLOPRAMIDE HCL 10 MG PO TABS: 10.0000 mg | ORAL_TABLET | Freq: Three times a day (TID) | ORAL | 0 refills | Status: DC | PRN

## 2020-01-15 MED ORDER — POTASSIUM CHLORIDE CRYS ER 20 MEQ PO TBCR
40.0000 meq | EXTENDED_RELEASE_TABLET | Freq: Once | ORAL | Status: AC
  Administered 2020-01-15: 40 meq via ORAL
  Filled 2020-01-15: qty 2

## 2020-01-15 NOTE — ED Notes (Signed)
Pt states she is feeling better and would like to be discharged. Pt informed that we still need to have her lab work resulted and for her to see a MD. Pt verbalizes understanding.

## 2020-01-15 NOTE — ED Provider Notes (Signed)
[facility] Emergency Department Provider Note  ____________________________________________  Time seen: Approximately 12:51 AM  I have reviewed the triage vital signs and the nursing notes.   HISTORY  Chief Complaint Emesis and Nausea   HPI Landon Radloff is a 24 y.o. female G2P1 at  [redacted] weeks GA who presents for evaluation of nausea and vomiting.  Patient has been having daily nausea and vomiting since the beginning of her pregnancy.  Symptoms of gotten worse over the last week.  No abdominal pain, no fever chills, no vaginal bleeding, no dysuria or hematuria, no fever or chills.  She had similar problems with her first pregnancy.  PMH Anemia Varicella  Past Surgical History:  Procedure Laterality Date  . CESAREAN SECTION      Prior to Admission medications   Medication Sig Start Date End Date Taking? Authorizing Provider  amoxicillin (AMOXIL) 875 MG tablet Take 1 tablet (875 mg total) by mouth 2 (two) times daily. 11/17/19   Cuthriell, Delorise Royals, PA-C  magic mouthwash w/lidocaine SOLN Take 5 mLs by mouth 4 (four) times daily. 11/17/19   Cuthriell, Delorise Royals, PA-C  metoCLOPramide (REGLAN) 10 MG tablet Take 1 tablet (10 mg total) by mouth every 8 (eight) hours as needed for up to 3 days for nausea. 01/15/20 01/18/20  Nita Sickle, MD  promethazine (PHENERGAN) 12.5 MG tablet Take 1 tablet (12.5 mg total) by mouth every 6 (six) hours as needed for nausea or vomiting. 12/11/19   Chesley Noon, MD    Allergies Patient has no known allergies.  No family history on file.  Social History Social History   Tobacco Use  . Smoking status: Former Games developer  . Smokeless tobacco: Never Used  Substance Use Topics  . Alcohol use: No  . Drug use: No    Review of Systems  Constitutional: Negative for fever. Eyes: Negative for visual changes. ENT: Negative for sore throat. Neck: No neck pain  Cardiovascular: Negative for chest pain. Respiratory:  Negative for shortness of breath. Gastrointestinal: Negative for abdominal pain, diarrhea. + nausea and vomiting Genitourinary: Negative for dysuria. Musculoskeletal: Negative for back pain. Skin: Negative for rash. Neurological: Negative for headaches, weakness or numbness. Psych: No SI or HI  ____________________________________________   PHYSICAL EXAM:  VITAL SIGNS: Vitals:   01/14/20 2140 01/15/20 0032  BP: (!) 141/86   Pulse: (!) 120 (!) 104  Resp: 20   Temp: 98.4 F (36.9 C)   SpO2: 98%     Constitutional: Alert and oriented. Well appearing and in no apparent distress. HEENT:      Head: Normocephalic and atraumatic.         Eyes: Conjunctivae are normal. Sclera is non-icteric.       Mouth/Throat: Mucous membranes are moist.       Neck: Supple with no signs of meningismus. Cardiovascular: Tachycardic with regular rate and rhythm. No murmurs, gallops, or rubs. 2+ symmetrical distal pulses are present in all extremities. No JVD. Respiratory: Normal respiratory effort. Lungs are clear to auscultation bilaterally. No wheezes, crackles, or rhonchi.  Gastrointestinal: Soft, non tender, and non distended with positive bowel sounds. No rebound or guarding. Musculoskeletal: Nontender with normal range of motion in all extremities. No edema, cyanosis, or erythema of extremities. Neurologic: Normal speech and language. Face is symmetric. Moving all extremities. No gross focal neurologic deficits are appreciated. Skin: Skin is warm, dry and intact. No rash noted. Psychiatric: Mood and affect are normal. Speech and behavior are normal.  ____________________________________________  LABS (all labs ordered are listed, but only abnormal results are displayed)  Labs Reviewed  URINALYSIS, COMPLETE (UACMP) WITH MICROSCOPIC - Abnormal; Notable for the following components:      Result Value   Color, Urine AMBER (*)    APPearance CLOUDY (*)    Ketones, ur 20 (*)    Protein, ur 100  (*)    Leukocytes,Ua TRACE (*)    Bacteria, UA RARE (*)    All other components within normal limits  CBC - Abnormal; Notable for the following components:   MCV 75.2 (*)    MCHC 36.1 (*)    All other components within normal limits  COMPREHENSIVE METABOLIC PANEL - Abnormal; Notable for the following components:   Sodium 133 (*)    Potassium 2.6 (*)    Chloride 93 (*)    BUN 21 (*)    ALT 49 (*)    Total Bilirubin 1.9 (*)    All other components within normal limits   ____________________________________________  EKG  ED ECG REPORT I, Rudene Re, the attending physician, personally viewed and interpreted this ECG.  Normal sinus rhythm, rate of 90, normal intervals, normal axis, no ST elevations or depressions. ____________________________________________  RADIOLOGY  none  ____________________________________________   PROCEDURES  Procedure(s) performed: None Procedures Critical Care performed:  None ____________________________________________   INITIAL IMPRESSION / ASSESSMENT AND PLAN / ED COURSE  24 y.o. female G2P1 at  [redacted] weeks GA who presents for evaluation of nausea and vomiting.  Presentation concerning for hyperemesis.  Labs showing hypokalemia with no EKG changes which was supplemented.  Patient received IV fluids and Zofran with resolution of her symptoms.  She is tolerating p.o.  Bedside ultrasound showing normal IUP with good fetal movement and fetal heart rate of 169.  Abdomen is soft otherwise with no tenderness.  Patient be discharged home on Reglan and close follow-up with OB/GYN.  Discussed my standard return precautions.       As part of my medical decision making, I reviewed the following data within the [place] notes reviewed and incorporated, Labs reviewed , EKG interpreted , Old chart reviewed, Notes from prior ED visits and [place] Controlled Substance Database   Please note:  Patient was evaluated in Emergency  Department today for the symptoms described in the history of present illness. Patient was evaluated in the context of the global COVID-19 pandemic, which necessitated consideration that the patient might be at risk for infection with the SARS-CoV-2 virus that causes COVID-19. Institutional protocols and algorithms that pertain to the evaluation of patients at risk for COVID-19 are in a state of rapid change based on information released by regulatory bodies including the CDC and federal and state organizations. These policies and algorithms were followed during the patient's care in the ED.  Some ED evaluations and interventions may be delayed as a result of limited staffing during the pandemic.   ____________________________________________   FINAL CLINICAL IMPRESSION(S) / ED DIAGNOSES   Final diagnoses:  Hyperemesis gravidarum  Hypokalemia      NEW MEDICATIONS STARTED DURING THIS VISIT:  ED Discharge Orders         Ordered    metoCLOPramide (REGLAN) 10 MG tablet  Every 8 hours PRN     01/15/20 0050           Note:  This document was prepared using Dragon voice recognition software and may include unintentional dictation errors.    Alfred Levins, [place], MD 01/15/20 [phone]

## 2020-01-15 NOTE — ED Notes (Signed)
Date and time results received: 01/15/20 12:25 AM  (use smartphrase ".now" to insert current time)  Test: K+ Critical Value: 2.6  Name of Provider Notified: Paduchowski  Orders Received? Or Actions Taken?: Orders Received - See Orders for details

## 2020-06-13 ENCOUNTER — Other Ambulatory Visit: Payer: Self-pay

## 2020-06-13 ENCOUNTER — Emergency Department
Admission: EM | Admit: 2020-06-13 | Discharge: 2020-06-13 | Disposition: A | Discharge status: Home / Self Care | Payer: Medicaid Other | Attending: Emergency Medicine | Admitting: Emergency Medicine

## 2020-06-13 ENCOUNTER — Emergency Department: Payer: Medicaid Other

## 2020-06-13 DIAGNOSIS — Z3A35 35 weeks gestation of pregnancy: Secondary | ICD-10-CM | POA: Diagnosis not present

## 2020-06-13 DIAGNOSIS — Z5321 Procedure and treatment not carried out due to patient leaving prior to being seen by health care provider: Secondary | ICD-10-CM | POA: Diagnosis not present

## 2020-06-13 DIAGNOSIS — Y9301 Activity, walking, marching and hiking: Secondary | ICD-10-CM | POA: Diagnosis not present

## 2020-06-13 DIAGNOSIS — O9A213 Injury, poisoning and certain other consequences of external causes complicating pregnancy, third trimester: Secondary | ICD-10-CM | POA: Diagnosis not present

## 2020-06-13 DIAGNOSIS — W19XXXA Unspecified fall, initial encounter: Secondary | ICD-10-CM | POA: Diagnosis not present

## 2020-06-13 DIAGNOSIS — S90812A Abrasion, left foot, initial encounter: Secondary | ICD-10-CM | POA: Insufficient documentation

## 2020-06-13 DIAGNOSIS — Y998 Other external cause status: Secondary | ICD-10-CM | POA: Insufficient documentation

## 2020-06-13 DIAGNOSIS — Y9289 Other specified places as the place of occurrence of the external cause: Secondary | ICD-10-CM | POA: Insufficient documentation

## 2020-06-13 NOTE — ED Notes (Signed)
No answer when called several times from lobby 

## 2020-06-13 NOTE — ED Triage Notes (Signed)
Pt to the er for injury r/t a fall. Pt states she was walkibng on stepping stones and believes she stepped in between the stones and foot slid and caused the abrasion to the left medial foot. Pt is 35 weeks. Denies falling on her abd or any problems with the baby at this time.

## 2021-07-06 ENCOUNTER — Other Ambulatory Visit: Payer: Self-pay

## 2021-07-06 ENCOUNTER — Emergency Department
Admission: EM | Admit: 2021-07-06 | Discharge: 2021-07-06 | Disposition: A | Discharge status: Home / Self Care | Payer: Medicaid Other | Attending: Emergency Medicine | Admitting: Emergency Medicine

## 2021-07-06 DIAGNOSIS — Z3A00 Weeks of gestation of pregnancy not specified: Secondary | ICD-10-CM | POA: Insufficient documentation

## 2021-07-06 DIAGNOSIS — Z87891 Personal history of nicotine dependence: Secondary | ICD-10-CM | POA: Insufficient documentation

## 2021-07-06 DIAGNOSIS — O219 Vomiting of pregnancy, unspecified: Secondary | ICD-10-CM | POA: Diagnosis not present

## 2021-07-06 DIAGNOSIS — O10019 Pre-existing essential hypertension complicating pregnancy, unspecified trimester: Secondary | ICD-10-CM | POA: Insufficient documentation

## 2021-07-06 HISTORY — DX: Essential (primary) hypertension: I10

## 2021-07-06 LAB — URINALYSIS, COMPLETE (UACMP) WITH MICROSCOPIC
Bilirubin Urine: NEGATIVE
Glucose, UA: NEGATIVE mg/dL
Hgb urine dipstick: NEGATIVE
Ketones, ur: 80 mg/dL — AB
Nitrite: NEGATIVE
Protein, ur: 100 mg/dL — AB
Specific Gravity, Urine: 1.03 (ref 1.005–1.030)
pH: 5 (ref 5.0–8.0)

## 2021-07-06 LAB — COMPREHENSIVE METABOLIC PANEL
ALT: 16 U/L (ref 0–44)
AST: 21 U/L (ref 15–41)
Albumin: 4.7 g/dL (ref 3.5–5.0)
Alkaline Phosphatase: 49 U/L (ref 38–126)
Anion gap: 15 (ref 5–15)
BUN: 21 mg/dL — ABNORMAL HIGH (ref 6–20)
CO2: 24 mmol/L (ref 22–32)
Calcium: 9.9 mg/dL (ref 8.9–10.3)
Chloride: 95 mmol/L — ABNORMAL LOW (ref 98–111)
Creatinine, Ser: 0.97 mg/dL (ref 0.44–1.00)
GFR, Estimated: >60 mL/min (ref >60)
Glucose, Bld: 100 mg/dL — ABNORMAL HIGH (ref 70–99)
Potassium: 3.2 mmol/L — ABNORMAL LOW (ref 3.5–5.1)
Sodium: 134 mmol/L — ABNORMAL LOW (ref 135–145)
Total Bilirubin: 1.3 mg/dL — ABNORMAL HIGH (ref 0.3–1.2)
Total Protein: 8.8 g/dL — ABNORMAL HIGH (ref 6.5–8.1)

## 2021-07-06 LAB — CBC
HCT: 41.2 % (ref 36.0–46.0)
Hemoglobin: 14.9 g/dL (ref 12.0–15.0)
MCH: 27 pg (ref 26.0–34.0)
MCHC: 36.2 g/dL — ABNORMAL HIGH (ref 30.0–36.0)
MCV: 74.6 fL — ABNORMAL LOW (ref 80.0–100.0)
Platelets: 429 10*3/uL — ABNORMAL HIGH (ref 150–400)
RBC: 5.52 MIL/uL — ABNORMAL HIGH (ref 3.87–5.11)
RDW: 13.2 % (ref 11.5–15.5)
WBC: 5.4 10*3/uL (ref 4.0–10.5)
nRBC: 0 % (ref 0.0–0.2)

## 2021-07-06 LAB — HCG, QUANTITATIVE, PREGNANCY: hCG, Beta Chain, Quant, S: 144964 m[IU]/mL — ABNORMAL HIGH (ref <5)

## 2021-07-06 LAB — LIPASE, BLOOD: Lipase: 34 U/L (ref 11–51)

## 2021-07-06 LAB — POC URINE PREG, ED: Preg Test, Ur: POSITIVE — AB

## 2021-07-06 MED ORDER — DOXYLAMINE-PYRIDOXINE 10-10 MG PO TBEC: 1.0000 | DELAYED_RELEASE_TABLET | Freq: Every day | ORAL | 1 refills | Status: DC

## 2021-07-06 MED ORDER — SODIUM CHLORIDE 0.9 % IV BOLUS
1000.0000 mL | Freq: Once | INTRAVENOUS | Status: AC
  Administered 2021-07-06: 1000 mL via INTRAVENOUS

## 2021-07-06 MED ORDER — METOCLOPRAMIDE HCL 5 MG/ML IJ SOLN
10.0000 mg | Freq: Once | INTRAMUSCULAR | Status: AC
  Administered 2021-07-06: 10 mg via INTRAVENOUS
  Filled 2021-07-06: qty 2

## 2021-07-06 NOTE — ED Provider Notes (Signed)
[facility] Emergency Department Provider Note  ____________________________________________   Event Date/Time   First MD Initiated Contact with Patient 07/06/21 1246     (approximate)  I have reviewed the triage vital signs and the nursing notes.   HISTORY  Chief Complaint Emesis    HPI Brenae Lasecki is a 25 y.o. female presents emergency department with vomiting for 3 to 4 days.  Patient states that the last time this happened she was pregnant.  No idea if she is pregnant at this time.  She denies any burning with urination.  She denies any abdominal pain or vaginal discharge.  She states no cramping or bleeding noted.  Past Medical History:  Diagnosis Date   Hypertension     There are no problems to display for this patient.   Past Surgical History:  Procedure Laterality Date   CESAREAN SECTION      Prior to Admission medications   Medication Sig Start Date End Date Taking? Authorizing Provider  Doxylamine-Pyridoxine 10-10 MG TBEC Take 1 tablet by mouth daily. If after 2 days she continued to have nausea and vomiting, 1 pill in the morning and 1 at night 07/06/21  Yes Zorana Brockwell, Roselyn Bering, PA-C  amoxicillin (AMOXIL) 875 MG tablet Take 1 tablet (875 mg total) by mouth 2 (two) times daily. 11/17/19   Cuthriell, Delorise Royals, PA-C  magic mouthwash w/lidocaine SOLN Take 5 mLs by mouth 4 (four) times daily. 11/17/19   Cuthriell, Delorise Royals, PA-C  metoCLOPramide (REGLAN) 10 MG tablet Take 1 tablet (10 mg total) by mouth every 8 (eight) hours as needed for up to 3 days for nausea. 01/15/20 01/18/20  Nita Sickle, MD  promethazine (PHENERGAN) 12.5 MG tablet Take 1 tablet (12.5 mg total) by mouth every 6 (six) hours as needed for nausea or vomiting. 12/11/19   Chesley Noon, MD    Allergies Patient has no known allergies.  History reviewed. No pertinent family history.  Social History Social History   Tobacco Use   Smoking status: Former    Pack  years: 0.00   Smokeless tobacco: Never  Vaping Use   Vaping Use: Never used  Substance Use Topics   Alcohol use: No   Drug use: No    Review of Systems  Constitutional: No fever/chills Eyes: No visual changes. ENT: No sore throat. Respiratory: Denies cough Cardiovascular: Denies chest pain Gastrointestinal: Denies abdominal pain, positive vomiting Genitourinary: Negative for dysuria. Musculoskeletal: Negative for back pain. Skin: Negative for rash. Psychiatric: no mood changes,     ____________________________________________   PHYSICAL EXAM:  VITAL SIGNS: ED Triage Vitals  Enc Vitals Group     BP 07/06/21 1205 137/80     Pulse Rate 07/06/21 1204 97     Resp 07/06/21 1204 (!) 22     Temp 07/06/21 1204 (!) 97.4 F (36.3 C)     Temp Source 07/06/21 1204 Oral     SpO2 07/06/21 1204 98 %     Weight 07/06/21 1204 200 lb (90.7 kg)     Height 07/06/21 1204 5\' 8"  (1.727 m)     Head Circumference --      Peak Flow --      Pain Score 07/06/21 1204 0     Pain Loc --      Pain Edu? --      Excl. in GC? --     Constitutional: Alert and oriented. Well appearing and in no acute distress. Eyes: Conjunctivae are normal.  Head: Atraumatic.  Nose: No congestion/rhinnorhea. Mouth/Throat: Mucous membranes are moist.   Neck:  supple no lymphadenopathy noted Cardiovascular: Normal rate, regular rhythm. Heart sounds are normal Respiratory: Normal respiratory effort.  No retractions, lungs c t a  Abd: soft nontender bs normal all 4 quad GU: deferred Musculoskeletal: FROM all extremities, warm and well perfused Neurologic:  Normal speech and language.  Skin:  Skin is warm, dry and intact. No rash noted. Psychiatric: Mood and affect are normal. Speech and behavior are normal.  ____________________________________________   LABS (all labs ordered are listed, but only abnormal results are displayed)  Labs Reviewed  COMPREHENSIVE METABOLIC PANEL - Abnormal; Notable for the  following components:      Result Value   Sodium 134 (*)    Potassium 3.2 (*)    Chloride 95 (*)    Glucose, Bld 100 (*)    BUN 21 (*)    Total Protein 8.8 (*)    Total Bilirubin 1.3 (*)    All other components within normal limits  CBC - Abnormal; Notable for the following components:   RBC 5.52 (*)    MCV 74.6 (*)    MCHC 36.2 (*)    Platelets 429 (*)    All other components within normal limits  URINALYSIS, COMPLETE (UACMP) WITH MICROSCOPIC - Abnormal; Notable for the following components:   Color, Urine AMBER (*)    APPearance CLOUDY (*)    Ketones, ur 80 (*)    Protein, ur 100 (*)    Leukocytes,Ua TRACE (*)    Bacteria, UA FEW (*)    All other components within normal limits  HCG, QUANTITATIVE, PREGNANCY - Abnormal; Notable for the following components:   hCG, Beta Chain, Quant, S 144,964 (*)    All other components within normal limits  POC URINE PREG, ED - Abnormal; Notable for the following components:   Preg Test, Ur POSITIVE (*)    All other components within normal limits  LIPASE, BLOOD   ____________________________________________   ____________________________________________  RADIOLOGY    ____________________________________________   PROCEDURES  Procedure(s) performed: No  Procedures    ____________________________________________   INITIAL IMPRESSION / ASSESSMENT AND PLAN / ED COURSE  Pertinent labs & imaging results that were available during my care of the patient were reviewed by me and considered in my medical decision making (see chart for details).   The patient's 25 year old female presents with vomiting.  See HPI.  Physical exam shows patient appears stable at this time  DDx: Gastroenteritis, small bowel obstruction, vomiting in pregnancy, UTI, pyelonephritis  Poc pregnancy is positive CBC is normal, comprehensive metabolic panel has decreased sodium of 134, decreased potassium of 3.2, glucose is 100, BUN is elevated at 21,  urinalysis has 80 ketones, trace of leuks and very few bacteria with hyaline cast.  I do not think the patient has gastroenteritis, she does not have small bowel obstruction, feel this is all vomiting secondary to pregnancy.  She does appear to be little dehydrated.  Will give 1 L normal saline along with Reglan  Beta-hCG is pending   Clinical Course as of 07/06/21 1726  Sat Jul 06, 2021  1511 hCG, quantitative, pregnancy [SF]  1721 HCG, Beta Chain, [place] Longest(!): 119,147 [SF]    Clinical Course User Index [SF] Sherrie Mustache Roselyn Bering, PA-C   I did try to call the patient to notify her of her beta-hCG level.  Left a message for her to return my call.  She was given instructions to follow-up with [facility] or [facility].  Is patient was unsure on whether she wanted to keep the pregnancy or not.  She was discharged in stable condition.  Tamara Krause was evaluated in Emergency Department on 07/06/2021 for the symptoms described in the history of present illness. She was evaluated in the context of the global COVID-19 pandemic, which necessitated consideration that the patient might be at risk for infection with the SARS-CoV-2 virus that causes COVID-19. Institutional protocols and algorithms that pertain to the evaluation of patients at risk for COVID-19 are in a state of rapid change based on information released by regulatory bodies including the CDC and federal and state organizations. These policies and algorithms were followed during the patient's care in the ED.    As part of my medical decision making, I reviewed the following data within the electronic MEDICAL RECORD NUMBER Nursing notes reviewed and incorporated, Labs reviewed , Old chart reviewed, Notes from prior ED visits, and [place] Controlled Substance Database  ____________________________________________   FINAL CLINICAL IMPRESSION(S) / ED DIAGNOSES  Final diagnoses:  Nausea and vomiting in pregnancy       NEW MEDICATIONS STARTED DURING THIS VISIT:  Discharge Medication List as of 07/06/2021  2:20 PM     START taking these medications   Details  Doxylamine-Pyridoxine 10-10 MG TBEC Take 1 tablet by mouth daily. If after 2 days she continued to have nausea and vomiting, 1 pill in the morning and 1 at night, Starting Sat 07/06/2021, Normal         Note:  This document was prepared using Dragon voice recognition software and may include unintentional dictation errors.    Faythe Ghee, PA-C 07/06/21 1726    Phineas Semen, MD 07/07/21 [phone]

## 2021-07-06 NOTE — ED Triage Notes (Signed)
Pt arrives to ER c/o of vomiting x few days. Denies pain. Denies diarrhea, fever. Denies urinary symptoms. Pt is anxious and hyperventilating. Instructed to take slow deep breaths. States hasn't been able to keep BP meds down. Unsure LMP.

## 2021-07-06 NOTE — Discharge Instructions (Addendum)
:  follow up with the [place] [facility] if you plan to continue the pregnancy.  take otc prenatal pills, rx medicine for nausea and vomitingancy,   follow up with one of the follow [facility] clinics if you decide to terminate the pregnancy [address] [place], [place] [address] Hours:  Closed ? Opens 11AM Mon Updated by this business 7 weeks ago Phone: [phone]  [address] [place], [place] [address] Hours:  Closed ? Opens 9AM Tue Phone: [phone]

## 2021-10-31 IMAGING — US US OB COMP LESS 14 WK
1 series · 14 of 28 positions shown · non-contrast
Comparison: None.

CLINICAL DATA: Positive pregnancy test with nausea and vomiting

EXAM:
OBSTETRIC <14 WK ULTRASOUND
TECHNIQUE: Transabdominal ultrasound was performed for evaluation of the
gestation as well as the maternal uterus and adnexal regions.

[Series 1: us ob comp less 14 wk · 14 of 43 slices shown]
[im 2/43]
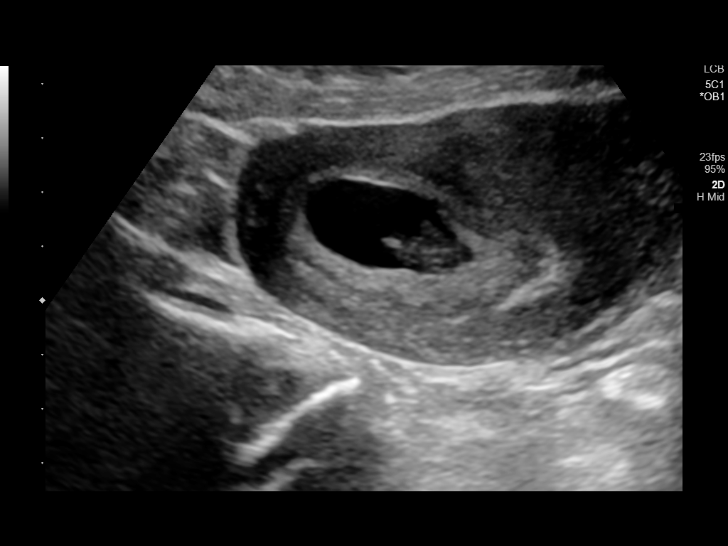
[im 5/43]
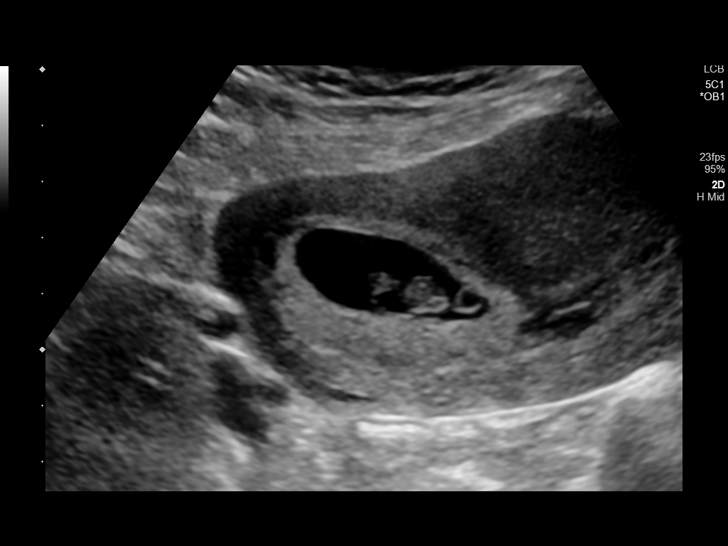
[im 8/43]
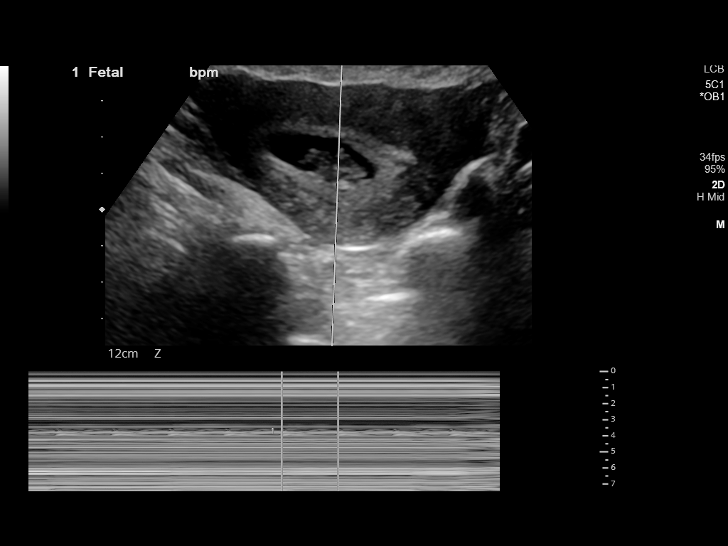
[im 11/43]
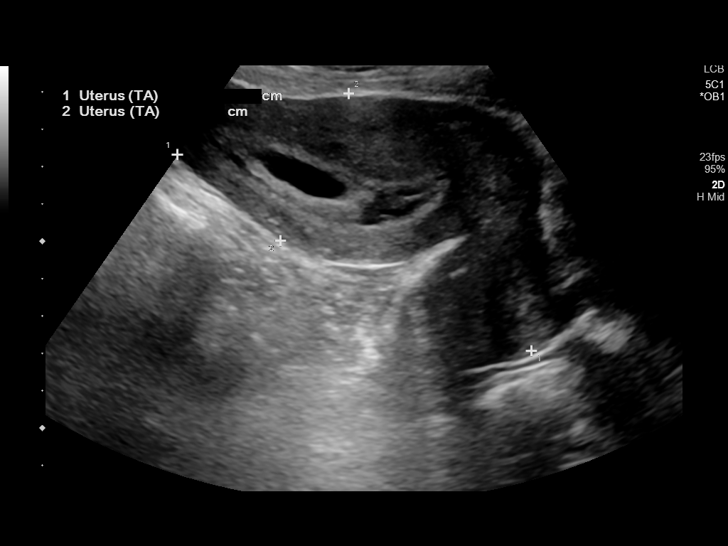
[im 15/43]
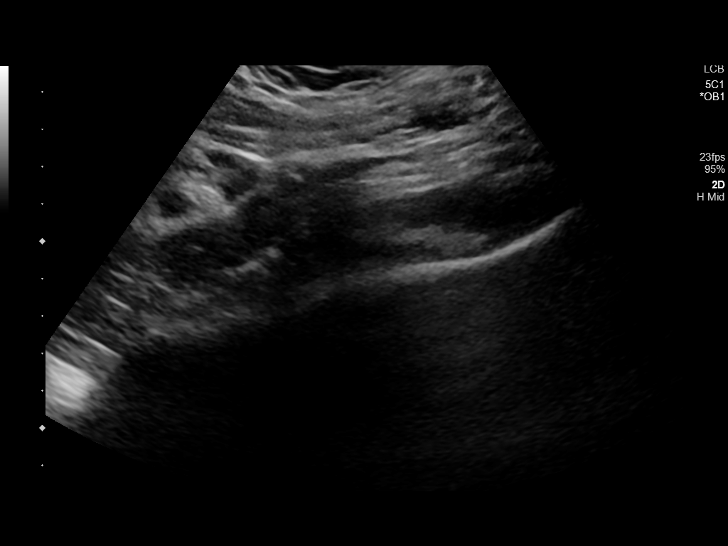
[im 18/43]
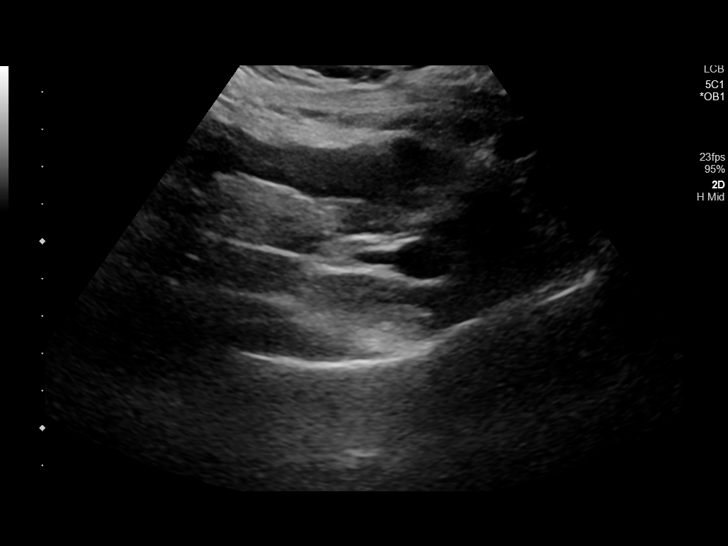
[im 21/43]
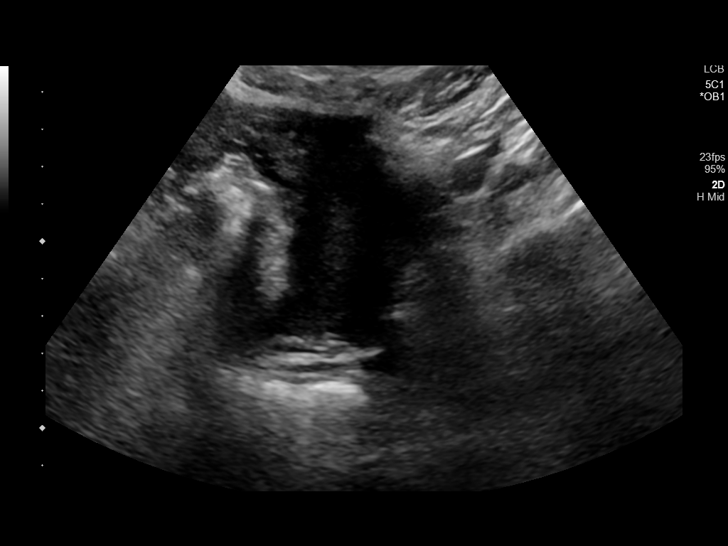
[im 24/43]
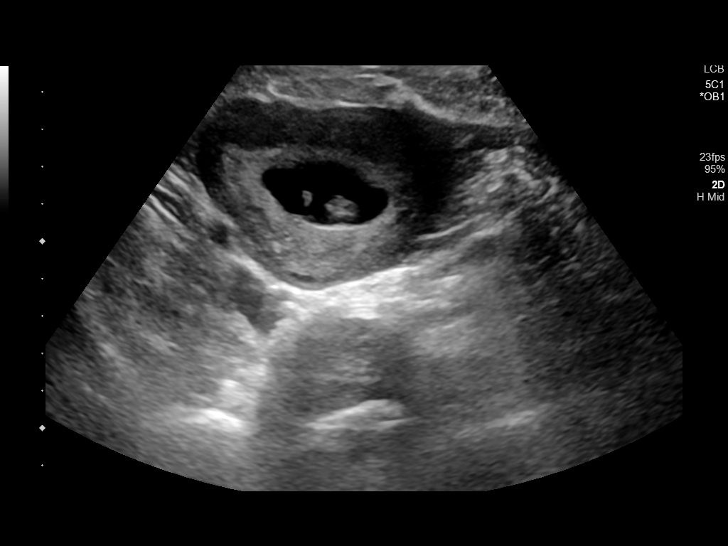
[im 27/43]
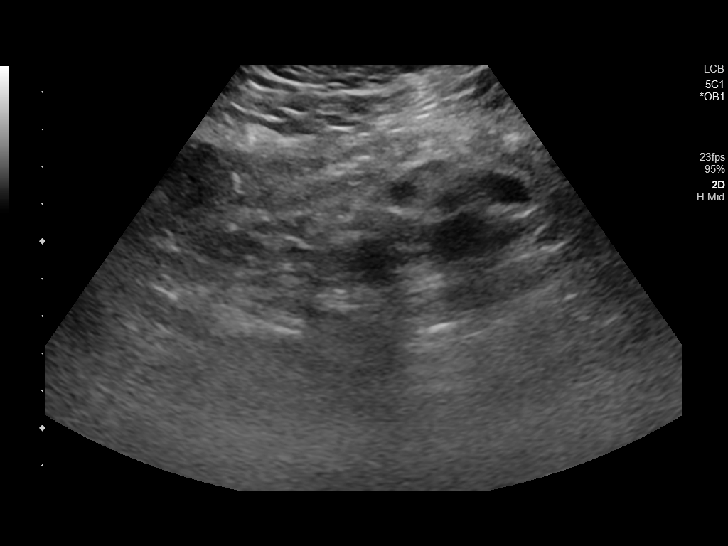
[im 30/43]
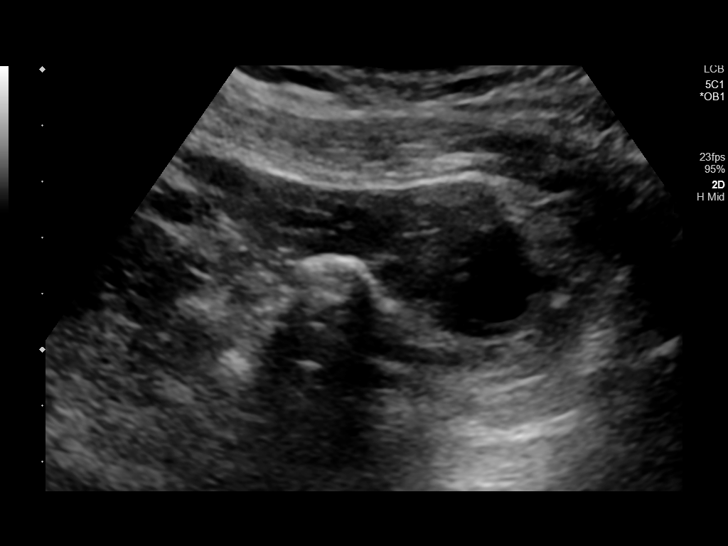
[im 33/43]
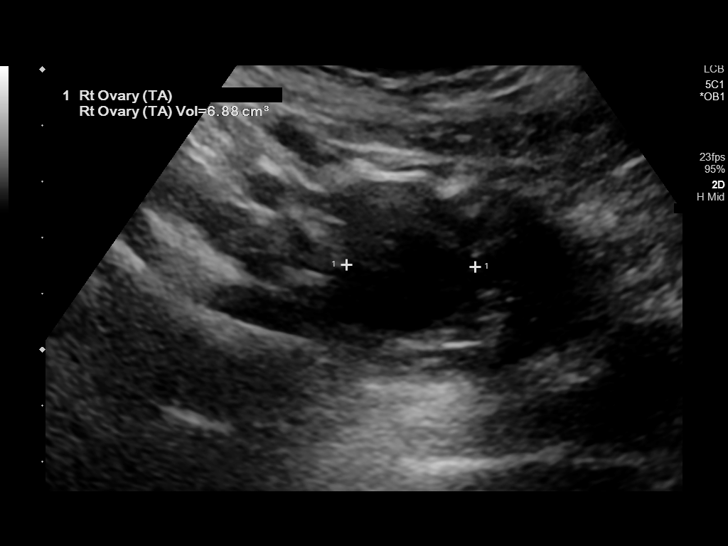
[im 36/43]
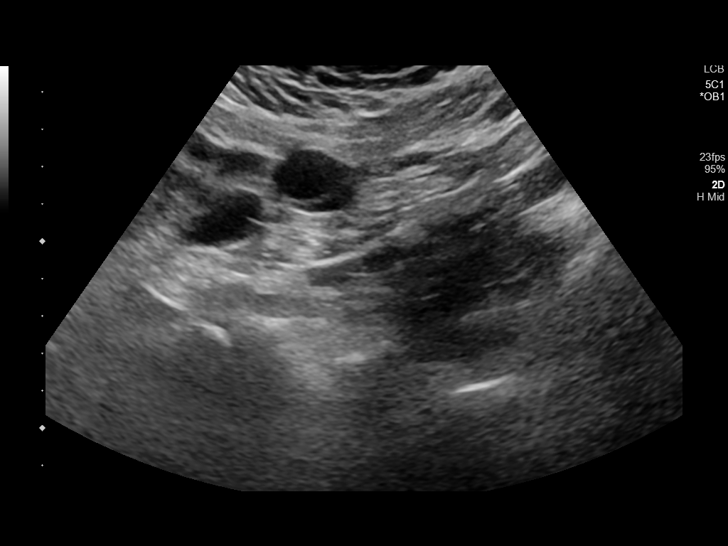
[im 39/43]
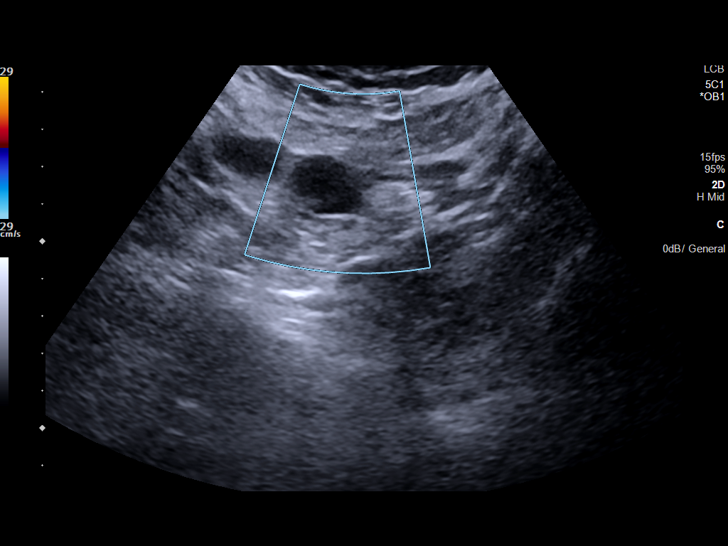
[im 43/43]
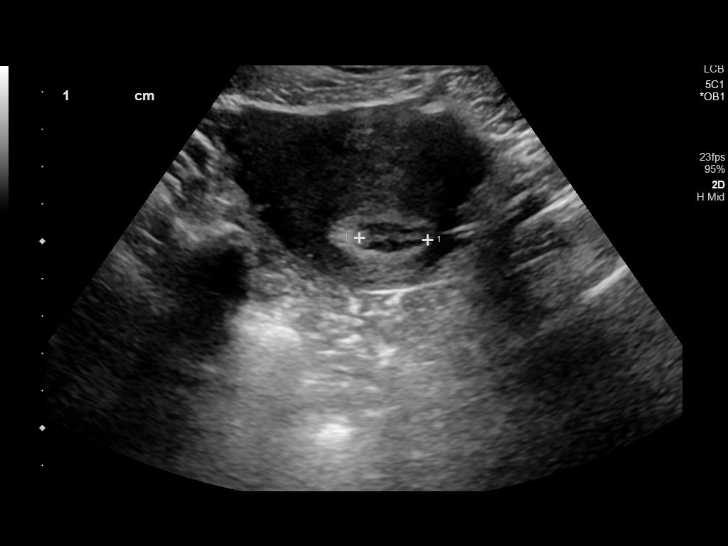

[14 of 28 positions shown; findings below may reference images not displayed]

FINDINGS: Intrauterine gestational sac: Present

Yolk sac:  Present

Embryo:  Present

Cardiac Activity: Present

Heart Rate: 182 bpm

CRL:   21 mm   8 w 5 d                  US EDC: 07/17/2020

Subchorionic hemorrhage: Small area of subchorionic hemorrhage is
noted adjacent to the gestational sac

Maternal uterus/adnexae: Ovaries are within normal limits. No free
fluid is seen.
IMPRESSION: Single live intrauterine gestation at 8 weeks 5 days with small
subchorionic hemorrhage. No other focal abnormality is noted.

## 2021-11-19 ENCOUNTER — Encounter: Payer: Self-pay | Admitting: Emergency Medicine

## 2021-11-19 ENCOUNTER — Other Ambulatory Visit: Payer: Self-pay

## 2021-11-19 ENCOUNTER — Emergency Department
Admission: EM | Admit: 2021-11-19 | Discharge: 2021-11-19 | Disposition: A | Discharge status: Home / Self Care | Payer: Medicaid Other | Attending: Emergency Medicine | Admitting: Emergency Medicine

## 2021-11-19 DIAGNOSIS — Z23 Encounter for immunization: Secondary | ICD-10-CM | POA: Diagnosis not present

## 2021-11-19 DIAGNOSIS — I10 Essential (primary) hypertension: Secondary | ICD-10-CM | POA: Diagnosis not present

## 2021-11-19 DIAGNOSIS — X088XXA Exposure to other specified smoke, fire and flames, initial encounter: Secondary | ICD-10-CM | POA: Insufficient documentation

## 2021-11-19 DIAGNOSIS — T23162A Burn of first degree of back of left hand, initial encounter: Secondary | ICD-10-CM | POA: Diagnosis not present

## 2021-11-19 DIAGNOSIS — O9A212 Injury, poisoning and certain other consequences of external causes complicating pregnancy, second trimester: Secondary | ICD-10-CM | POA: Insufficient documentation

## 2021-11-19 DIAGNOSIS — T310 Burns involving less than 10% of body surface: Secondary | ICD-10-CM | POA: Diagnosis not present

## 2021-11-19 DIAGNOSIS — Z87891 Personal history of nicotine dependence: Secondary | ICD-10-CM | POA: Insufficient documentation

## 2021-11-19 MED ORDER — BACITRACIN 500 UNIT/GM EX OINT: 1.0000 "application " | TOPICAL_OINTMENT | Freq: Two times a day (BID) | CUTANEOUS | 0 refills | Status: DC

## 2021-11-19 MED ORDER — TETANUS-DIPHTH-ACELL PERTUSSIS 5-2.5-18.5 LF-MCG/0.5 IM SUSY
0.5000 mL | PREFILLED_SYRINGE | Freq: Once | INTRAMUSCULAR | Status: AC
  Administered 2021-11-19: 0.5 mL via INTRAMUSCULAR
  Filled 2021-11-19: qty 0.5

## 2021-11-19 MED ORDER — ACETAMINOPHEN 500 MG PO TABS
1000.0000 mg | ORAL_TABLET | Freq: Once | ORAL | Status: AC
  Administered 2021-11-19: 1000 mg via ORAL
  Filled 2021-11-19: qty 2
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
## 2023-09-16 ENCOUNTER — Inpatient Hospital Stay (HOSPITAL_COMMUNITY)
Admission: AD | Admit: 2023-09-16 | Discharge: 2023-09-17 | Disposition: A | Discharge status: Home / Self Care | Payer: Medicaid Other | Attending: Obstetrics and Gynecology | Admitting: Obstetrics and Gynecology

## 2023-09-16 DIAGNOSIS — Z3A01 Less than 8 weeks gestation of pregnancy: Secondary | ICD-10-CM | POA: Insufficient documentation

## 2023-09-16 DIAGNOSIS — O219 Vomiting of pregnancy, unspecified: Secondary | ICD-10-CM | POA: Insufficient documentation

## 2023-09-17 ENCOUNTER — Encounter (HOSPITAL_COMMUNITY): Payer: Self-pay | Admitting: Obstetrics and Gynecology

## 2023-09-17 ENCOUNTER — Other Ambulatory Visit: Payer: Self-pay

## 2023-09-17 DIAGNOSIS — O219 Vomiting of pregnancy, unspecified: Secondary | ICD-10-CM | POA: Diagnosis not present

## 2023-09-17 DIAGNOSIS — Z3A01 Less than 8 weeks gestation of pregnancy: Secondary | ICD-10-CM | POA: Diagnosis not present

## 2023-09-17 DIAGNOSIS — Z3A13 13 weeks gestation of pregnancy: Secondary | ICD-10-CM | POA: Diagnosis not present

## 2023-09-17 DIAGNOSIS — R112 Nausea with vomiting, unspecified: Secondary | ICD-10-CM | POA: Diagnosis present

## 2023-09-17 LAB — COMPREHENSIVE METABOLIC PANEL
ALT: 14 U/L (ref 0–44)
AST: 17 U/L (ref 15–41)
Albumin: 4.3 g/dL (ref 3.5–5.0)
Alkaline Phosphatase: 49 U/L (ref 38–126)
Anion gap: 17 — ABNORMAL HIGH (ref 5–15)
BUN: 15 mg/dL (ref 6–20)
CO2: 20 mmol/L — ABNORMAL LOW (ref 22–32)
Calcium: 9.6 mg/dL (ref 8.9–10.3)
Chloride: 99 mmol/L (ref 98–111)
Creatinine, Ser: 1.03 mg/dL — ABNORMAL HIGH (ref 0.44–1.00)
GFR, Estimated: >60 mL/min (ref >60)
Glucose, Bld: 115 mg/dL — ABNORMAL HIGH (ref 70–99)
Potassium: 3.2 mmol/L — ABNORMAL LOW (ref 3.5–5.1)
Sodium: 136 mmol/L (ref 135–145)
Total Bilirubin: 1.1 mg/dL (ref 0.3–1.2)
Total Protein: 8.6 g/dL — ABNORMAL HIGH (ref 6.5–8.1)

## 2023-09-17 LAB — URINALYSIS, ROUTINE W REFLEX MICROSCOPIC
Glucose, UA: 50 mg/dL — AB
Hgb urine dipstick: NEGATIVE
Ketones, ur: 5 mg/dL — AB
Nitrite: NEGATIVE
Protein, ur: >=300 mg/dL — AB
Specific Gravity, Urine: 1.029 (ref 1.005–1.030)
WBC, UA: >50 WBC/hpf (ref 0–5)
pH: 5 (ref 5.0–8.0)

## 2023-09-17 LAB — LIPASE, BLOOD: Lipase: 26 U/L (ref 11–51)

## 2023-09-17 LAB — CBC
HCT: 36.7 % (ref 36.0–46.0)
Hemoglobin: 13.1 g/dL (ref 12.0–15.0)
MCH: 26.8 pg (ref 26.0–34.0)
MCHC: 35.7 g/dL (ref 30.0–36.0)
MCV: 75.2 fL — ABNORMAL LOW (ref 80.0–100.0)
Platelets: 361 10*3/uL (ref 150–400)
RBC: 4.88 MIL/uL (ref 3.87–5.11)
RDW: 13.6 % (ref 11.5–15.5)
WBC: 6.5 10*3/uL (ref 4.0–10.5)
nRBC: 0 % (ref 0.0–0.2)

## 2023-09-17 LAB — POCT PREGNANCY, URINE: Preg Test, Ur: POSITIVE — AB

## 2023-09-17 MED ORDER — LACTATED RINGERS IV BOLUS
1000.0000 mL | Freq: Once | INTRAVENOUS | Status: AC
  Administered 2023-09-17: 1000 mL via INTRAVENOUS

## 2023-09-17 MED ORDER — ONDANSETRON HCL 4 MG/2ML IJ SOLN
4.0000 mg | Freq: Once | INTRAMUSCULAR | Status: AC
  Administered 2023-09-17: 4 mg via INTRAVENOUS
  Filled 2023-09-17: qty 2

## 2023-09-17 MED ORDER — POTASSIUM CHLORIDE 10 MEQ/100ML IV SOLN
10.0000 meq | INTRAVENOUS | Status: DC
  Filled 2023-09-17 (×3): qty 100

## 2023-09-17 MED ORDER — VITAMIN B-6 25 MG PO TABS: 25.0000 mg | ORAL_TABLET | Freq: Three times a day (TID) | ORAL | 3 refills | Status: DC

## 2023-09-17 MED ORDER — PRENATAL 28-0.8 MG PO TABS
1.0000 | ORAL_TABLET | Freq: Every day | ORAL | 12 refills | Status: AC
Start: 2023-09-17 — End: ?

## 2023-09-17 MED ORDER — POTASSIUM CHLORIDE CRYS ER 20 MEQ PO TBCR
30.0000 meq | EXTENDED_RELEASE_TABLET | Freq: Once | ORAL | Status: AC
  Administered 2023-09-17: 30 meq via ORAL
  Filled 2023-09-17: qty 1

## 2023-09-17 MED ORDER — DOXYLAMINE SUCCINATE (SLEEP) 25 MG PO TABS: 25.0000 mg | ORAL_TABLET | Freq: Every day | ORAL | 2 refills | Status: DC

## 2023-09-17 MED ORDER — ONDANSETRON 4 MG PO TBDP: 4.0000 mg | ORAL_TABLET | Freq: Four times a day (QID) | ORAL | 0 refills | Status: DC | PRN

## 2023-09-17 MED ORDER — POTASSIUM CHLORIDE 20 MEQ PO PACK
40.0000 meq | PACK | Freq: Once | ORAL | Status: DC
  Filled 2023-09-17: qty 2

## 2023-09-17 NOTE — MAU Provider Note (Signed)
History    Chief Complaint  Patient presents with   Emesis   Nausea   27yo [birth year] at [redacted]w[redacted]d by unsure LMP presenting for nausea/vomiting  Pt reports 2 weeks of nausea/vomiting with more than 5 episodes of emesis today. She has not tried any medications for relief. She has not been able to tolerate liquids or solids. Has cough after vomiting but no fevers/chills or shortness of breath. Denies any abdominal pain, VB, or discharge.  Has not yet established care for this pregnancy. Plans to establish care at [facility]  PMH: HTN PSH: Csx2 Meds: antihypertensive - pt unsure if nifedipine or norvasc OB: G1 SAB, G2 term CS, G3 term VBAC, G4 term CS Soc: Denies tobacco, alcohol or drug use    Past Medical History:  Diagnosis Date   Hypertension     Past Surgical History:  Procedure Laterality Date   CESAREAN SECTION      No family history on file.  Social History   Tobacco Use   Smoking status: Former   Smokeless tobacco: Never  Advertising account planner   Vaping status: Never Used  Substance Use Topics   Alcohol use: No   Drug use: No    Allergies: No Known Allergies  Medications Prior to Admission  Medication Sig Dispense Refill Last Dose   amoxicillin (AMOXIL) 875 MG tablet Take 1 tablet (875 mg total) by mouth 2 (two) times daily. 14 tablet 0    bacitracin 500 UNIT/GM ointment Apply 1 application topically 2 (two) times daily. 15 g 0    Doxylamine-Pyridoxine 10-10 MG TBEC Take 1 tablet by mouth daily. If after 2 days she continued to have nausea and vomiting, 1 pill in the morning and 1 at night 60 tablet 1    magic mouthwash w/lidocaine SOLN Take 5 mLs by mouth 4 (four) times daily. 240 mL 0    metoCLOPramide (REGLAN) 10 MG tablet Take 1 tablet (10 mg total) by mouth every 8 (eight) hours as needed for up to 3 days for nausea. 20 tablet 0    promethazine (PHENERGAN) 12.5 MG tablet Take 1 tablet (12.5 mg total) by mouth every 6 (six) hours as needed for nausea or vomiting. 15  tablet 0     Review of Systems  All other systems reviewed and are negative. See HPI  Physical Exam Blood pressure 129/80, pulse 91, temperature 98 F (36.7 C), temperature source Oral, resp. rate 20, height 5\' 4"  (1.626 m), weight 65 kg, last menstrual period 06/13/2023, SpO2 100%, unknown if currently breastfeeding. Physical Exam Constitutional:      General: She is not in acute distress.    Appearance: She is not ill-appearing.  Cardiovascular:     Rate and Rhythm: Normal rate.  Pulmonary:     Effort: Pulmonary effort is normal.  Abdominal:     General: Abdomen is flat. There is no distension.     Tenderness: There is no abdominal tenderness.  Neurological:     Mental Status: She is alert.     MAU Course +UPT CBC unremarkable, no leukocytosis or anemia CMP w/ mild hypokalemia, possible AKI w/ K 3.2, Cr 1.03 Lipase normal UA +ketones, protein, glucose Bedside ultrasound confirms SLIUP with cardiac activity. Reviewed this is a limited ultrasound solely to confirm viable IUP and is not adequate for dating purposes 1L LR & IV zofran 4mg  with symptomatic improvement Tolerated PO intake including PO K supplement  Assessment/Plan: 26yo [birth year] at [redacted]w[redacted]d by unsure LMP presenting for nausea and  vomiting of pregnancy now s/p IVF, antiemetics & K replacement - tolerating PO and ready for discharge home  Discussed B6/unisom and rx for zofran prn Start PNV Pt to establish care with [facility] Return precautions given   Dispo: discharge home  Harvie Bridge, MD Obstetrician & Gynecologist, [facility] for [employer], [facility]

## 2023-09-17 NOTE — MAU Note (Addendum)
.  Tamara Krause is a 27 y.o. at Unknown here in MAU reporting: came in via EMS - N/V for the past couple weeks. Reports emesis >5x. Denies pain, VB, or abnormal discharge.  LMP: June 15 approximate but believes she is 2-[redacted] weeks pregnant.  Pain score: 0 Vitals:   09/17/23 0023  BP: 129/80  Pulse: 91  Resp: 20  Temp: 98 F (36.7 C)  SpO2: 100%     FHT:NA  Lab orders placed from triage: pregnancy urine; UA

## 2023-10-04 ENCOUNTER — Encounter (HOSPITAL_COMMUNITY): Payer: Self-pay

## 2023-10-04 ENCOUNTER — Other Ambulatory Visit: Payer: Self-pay

## 2023-10-04 ENCOUNTER — Inpatient Hospital Stay (HOSPITAL_COMMUNITY)
Admission: AD | Admit: 2023-10-04 | Discharge: 2023-10-04 | Disposition: A | Discharge status: Home / Self Care | Payer: Medicaid Other | Attending: Obstetrics & Gynecology | Admitting: Obstetrics & Gynecology

## 2023-10-04 ENCOUNTER — Encounter (HOSPITAL_COMMUNITY): Payer: Self-pay | Admitting: Obstetrics & Gynecology

## 2023-10-04 ENCOUNTER — Ambulatory Visit (HOSPITAL_COMMUNITY)
Admission: EM | Admit: 2023-10-04 | Discharge: 2023-10-04 | Disposition: A | Discharge status: Home / Self Care | Payer: Medicaid Other

## 2023-10-04 DIAGNOSIS — O219 Vomiting of pregnancy, unspecified: Secondary | ICD-10-CM | POA: Diagnosis present

## 2023-10-04 DIAGNOSIS — O10912 Unspecified pre-existing hypertension complicating pregnancy, second trimester: Secondary | ICD-10-CM | POA: Diagnosis not present

## 2023-10-04 DIAGNOSIS — R7989 Other specified abnormal findings of blood chemistry: Secondary | ICD-10-CM | POA: Diagnosis present

## 2023-10-04 DIAGNOSIS — K117 Disturbances of salivary secretion: Secondary | ICD-10-CM | POA: Diagnosis not present

## 2023-10-04 DIAGNOSIS — O26892 Other specified pregnancy related conditions, second trimester: Secondary | ICD-10-CM | POA: Insufficient documentation

## 2023-10-04 DIAGNOSIS — Z3A16 16 weeks gestation of pregnancy: Secondary | ICD-10-CM | POA: Insufficient documentation

## 2023-10-04 DIAGNOSIS — O26832 Pregnancy related renal disease, second trimester: Secondary | ICD-10-CM | POA: Insufficient documentation

## 2023-10-04 HISTORY — DX: Other specified abnormal findings of blood chemistry: R79.89

## 2023-10-04 LAB — COMPREHENSIVE METABOLIC PANEL
ALT: 22 U/L (ref 0–44)
AST: 18 U/L (ref 15–41)
Albumin: 4.4 g/dL (ref 3.5–5.0)
Alkaline Phosphatase: 54 U/L (ref 38–126)
Anion gap: 12 (ref 5–15)
BUN: 22 mg/dL — ABNORMAL HIGH (ref 6–20)
CO2: 30 mmol/L (ref 22–32)
Calcium: 10 mg/dL (ref 8.9–10.3)
Chloride: 90 mmol/L — ABNORMAL LOW (ref 98–111)
Creatinine, Ser: 1.6 mg/dL — ABNORMAL HIGH (ref 0.44–1.00)
GFR, Estimated: 45 mL/min — ABNORMAL LOW (ref >60)
Glucose, Bld: 116 mg/dL — ABNORMAL HIGH (ref 70–99)
Potassium: 3.3 mmol/L — ABNORMAL LOW (ref 3.5–5.1)
Sodium: 132 mmol/L — ABNORMAL LOW (ref 135–145)
Total Bilirubin: 1 mg/dL (ref 0.3–1.2)
Total Protein: 8.9 g/dL — ABNORMAL HIGH (ref 6.5–8.1)

## 2023-10-04 LAB — CBC
HCT: 38.9 % (ref 36.0–46.0)
Hemoglobin: 14.1 g/dL (ref 12.0–15.0)
MCH: 26.9 pg (ref 26.0–34.0)
MCHC: 36.2 g/dL — ABNORMAL HIGH (ref 30.0–36.0)
MCV: 74.1 fL — ABNORMAL LOW (ref 80.0–100.0)
Platelets: 417 10*3/uL — ABNORMAL HIGH (ref 150–400)
RBC: 5.25 MIL/uL — ABNORMAL HIGH (ref 3.87–5.11)
RDW: 13.2 % (ref 11.5–15.5)
WBC: 5 10*3/uL (ref 4.0–10.5)
nRBC: 0 % (ref 0.0–0.2)

## 2023-10-04 LAB — URINALYSIS, ROUTINE W REFLEX MICROSCOPIC
Glucose, UA: NEGATIVE mg/dL
Hgb urine dipstick: NEGATIVE
Ketones, ur: 5 mg/dL — AB
Nitrite: NEGATIVE
Protein, ur: 100 mg/dL — AB
Specific Gravity, Urine: 1.027 (ref 1.005–1.030)
pH: 5 (ref 5.0–8.0)

## 2023-10-04 MED ORDER — ONDANSETRON 8 MG PO TBDP
4.0000 mg | ORAL_TABLET | Freq: Three times a day (TID) | ORAL | 3 refills | Status: DC | PRN
Start: 2023-10-04 — End: 2023-10-14

## 2023-10-04 MED ORDER — POTASSIUM CHLORIDE 2 MEQ/ML IV SOLN
Freq: Once | INTRAVENOUS | Status: AC
  Filled 2023-10-04: qty 1000

## 2023-10-04 MED ORDER — SCOPOLAMINE 1 MG/3DAYS TD PT72
1.0000 | MEDICATED_PATCH | TRANSDERMAL | Status: DC
  Administered 2023-10-04: 1.5 mg via TRANSDERMAL
  Filled 2023-10-04: qty 1

## 2023-10-04 MED ORDER — POTASSIUM CHLORIDE 2 MEQ/ML IV SOLN
INTRAVENOUS | Status: DC
  Filled 2023-10-04 (×15): qty 1000

## 2023-10-04 MED ORDER — NIFEDIPINE ER OSMOTIC RELEASE 30 MG PO TB24: 30.0000 mg | ORAL_TABLET | Freq: Every day | ORAL | 3 refills | Status: DC

## 2023-10-04 MED ORDER — PROMETHAZINE HCL 25 MG PO TABS
25.0000 mg | ORAL_TABLET | Freq: Four times a day (QID) | ORAL | 2 refills | Status: DC | PRN
Start: 2023-10-04 — End: 2023-10-14

## 2023-10-04 MED ORDER — PROMETHAZINE HCL 25 MG/ML IJ SOLN
25.0000 mg | Freq: Once | INTRAVENOUS | Status: AC
  Administered 2023-10-04: 25 mg via INTRAVENOUS
  Filled 2023-10-04: qty 1

## 2023-10-04 MED ORDER — SCOPOLAMINE 1 MG/3DAYS TD PT72
1.0000 | MEDICATED_PATCH | TRANSDERMAL | 3 refills | Status: DC
Start: 2023-10-04 — End: 2023-10-14

## 2023-10-04 MED ORDER — ONDANSETRON 4 MG PO TBDP
8.0000 mg | ORAL_TABLET | Freq: Once | ORAL | Status: AC
  Administered 2023-10-04: 8 mg via ORAL
  Filled 2023-10-04: qty 2

## 2023-10-04 NOTE — ED Triage Notes (Signed)
Patient's significant other reports that she has been vomiting x 2 days. Patient reports feeling weak and feeling like she is going to pass out.  Patient had a similar episode in September.  Patient's significant other reports that the patient has lost her BP meds. BP-124/110.  Patient was prescribed Zofran, but has not taken.

## 2023-10-04 NOTE — MAU Note (Signed)
Tamara Krause is a 27 y.o. at [redacted]w[redacted]d here in MAU reporting: she's unable to keep anything down for the past few days.  Reports feels weak and light headed.  Denies VB or LOF. LMP: NA Onset of complaint: 3 days Pain score: 3 There were no vitals filed for this visit.   [id number] Lab orders placed from triage:   UA

## 2023-10-04 NOTE — ED Provider Notes (Signed)
About [redacted] weeks pregnant, here with vomiting and weakness x2 days. Seen in the MAU for intractable vomiting on 9/18. Was prescribed Unisom and zofran but not taking. Not tolerating any liquids.  Patient dry heaving in the waiting room and feels she is going to pass out.  Vitals are stable apart from elevated diastolic BP (124/110) has not taken her BP meds. She is ill-appearing and tearful. Emesis bag with NBNB liquid filling half the bag.  She is here with fiance. Sent to MAU for evaluation    Levern Kalka, Lurena Joiner, Cordelia Poche 10/04/23 1449

## 2023-10-04 NOTE — ED Notes (Signed)
Patient is being discharged from the Urgent Care and sent to the Emergency Department via POV . Per [employer], PA, patient is in need of higher level of care due to pregnancy, vomiting. Patient is aware and verbalizes understanding of plan of care.  Vitals:   10/04/23 1442  BP: (!) 124/110  Pulse: 97  Resp: 20  Temp: (!) 97.5 F (36.4 C)  SpO2: 100%

## 2023-10-04 NOTE — Discharge Instructions (Signed)
[place] [facility]   [facility]             [address], [place], [place] [address] [phone]  [facility]                                                             [address], [address], [place], [place], [address] [phone]  [facility]                                    [address], [address], [place], [place], [address] [phone]  [facility] [place] [address], [place], [place], [address] [phone]  [facility]                                 [address], [place], [place], [address] [phone]  [facility]                                    [address], [place], [place], [address] [phone]  [facility] [address], [address], [place], [place], [address]                              [facility] [address], [place], , [address] [phone]  [facility]         Phone: [phone]  [facility]      Phone: [phone]   [facility]      Phone: [phone]  [facility]         Phone: [phone]   [facility]    Phone: [phone]  [facility]      Phone: [phone]  [facility]      Phone: [phone]  [facility]        Phone: [phone]  [facility] (Sheronette Cousins) [phone]  [facility]      Phone: [phone]   

## 2023-10-07 ENCOUNTER — Telehealth: Payer: Self-pay | Admitting: Lactation Services

## 2023-10-07 NOTE — Telephone Encounter (Signed)
Received PA request for Scopolamine patches. Called Pharmacy to clarify. Spoke with pharmacy associate. They report it is not covered by insurance. Requested it be run as brand, per associate cannot order. Transderm is available however not covered by Medicaid.    Tried [employer]DC of [id number] and is covered and not available.   Tried NDC of [id number] and is covered but not available to order.   Tried [facility] [id number] and is covered but [facility] not able to order.   Called patient to let her know the above. Received message that call cannot be completed at this time.   Patient does not have Mychart. Will try to call again at a later time.

## 2023-10-09 ENCOUNTER — Telehealth: Payer: Self-pay | Admitting: Lactation Services

## 2023-10-09 NOTE — Telephone Encounter (Signed)
Scopolamine Patches PA sent through Cover My Meds today. Awaiting determination. See previous note.   Tamara Krause (KeyKyra Leyland) PA Case ID #: [id number] Rx #: [id number] Need Help? Call us at [phone] Status Sent to Plan today Next Steps The plan will fax you a determination, typically within 1 to 5 business days. Drug Scopolamine 1MG /3DAYS 72 hr patches ePA cloud logo Form [facility] [place] Electronic Prior Authorization Request Form Original Claim Info [id number]  Called patient, received message that call cannot be completed as dialed. Patient does not have My Chart. This is the second attempt to reach patient.

## 2023-10-09 NOTE — Telephone Encounter (Signed)
See next telephone encounter 

## 2023-10-13 ENCOUNTER — Other Ambulatory Visit: Payer: Self-pay

## 2023-10-13 ENCOUNTER — Inpatient Hospital Stay (HOSPITAL_COMMUNITY)
Admission: AD | Admit: 2023-10-13 | Discharge: 2023-10-14 | DRG: 832 | Disposition: A | Discharge status: Home / Self Care | Payer: Medicaid Other | Attending: Obstetrics and Gynecology | Admitting: Obstetrics and Gynecology

## 2023-10-13 ENCOUNTER — Encounter (HOSPITAL_COMMUNITY): Payer: Self-pay | Admitting: Obstetrics and Gynecology

## 2023-10-13 DIAGNOSIS — Z3A17 17 weeks gestation of pregnancy: Secondary | ICD-10-CM | POA: Diagnosis not present

## 2023-10-13 DIAGNOSIS — R55 Syncope and collapse: Secondary | ICD-10-CM | POA: Diagnosis present

## 2023-10-13 DIAGNOSIS — O26832 Pregnancy related renal disease, second trimester: Secondary | ICD-10-CM | POA: Diagnosis present

## 2023-10-13 DIAGNOSIS — Z23 Encounter for immunization: Secondary | ICD-10-CM | POA: Diagnosis not present

## 2023-10-13 DIAGNOSIS — O211 Hyperemesis gravidarum with metabolic disturbance: Secondary | ICD-10-CM | POA: Diagnosis present

## 2023-10-13 DIAGNOSIS — O99282 Endocrine, nutritional and metabolic diseases complicating pregnancy, second trimester: Secondary | ICD-10-CM | POA: Diagnosis present

## 2023-10-13 DIAGNOSIS — R7989 Other specified abnormal findings of blood chemistry: Secondary | ICD-10-CM

## 2023-10-13 DIAGNOSIS — N179 Acute kidney failure, unspecified: Secondary | ICD-10-CM | POA: Diagnosis present

## 2023-10-13 DIAGNOSIS — E871 Hypo-osmolality and hyponatremia: Secondary | ICD-10-CM

## 2023-10-13 DIAGNOSIS — K117 Disturbances of salivary secretion: Secondary | ICD-10-CM

## 2023-10-13 DIAGNOSIS — R7401 Elevation of levels of liver transaminase levels: Secondary | ICD-10-CM | POA: Diagnosis present

## 2023-10-13 DIAGNOSIS — O219 Vomiting of pregnancy, unspecified: Secondary | ICD-10-CM

## 2023-10-13 DIAGNOSIS — O10912 Unspecified pre-existing hypertension complicating pregnancy, second trimester: Secondary | ICD-10-CM

## 2023-10-13 DIAGNOSIS — Z87891 Personal history of nicotine dependence: Secondary | ICD-10-CM | POA: Diagnosis not present

## 2023-10-13 DIAGNOSIS — E876 Hypokalemia: Principal | ICD-10-CM

## 2023-10-13 DIAGNOSIS — O210 Mild hyperemesis gravidarum: Secondary | ICD-10-CM | POA: Diagnosis not present

## 2023-10-13 LAB — URINALYSIS, ROUTINE W REFLEX MICROSCOPIC
Glucose, UA: NEGATIVE mg/dL
Hgb urine dipstick: NEGATIVE
Ketones, ur: NEGATIVE mg/dL
Leukocytes,Ua: NEGATIVE
Nitrite: NEGATIVE
Protein, ur: 30 mg/dL — AB
Specific Gravity, Urine: 1.017 (ref 1.005–1.030)
pH: 5 (ref 5.0–8.0)

## 2023-10-13 LAB — TYPE AND SCREEN
ABO/RH(D): O POS
Antibody Screen: NEGATIVE

## 2023-10-13 LAB — COMPREHENSIVE METABOLIC PANEL
ALT: 108 U/L — ABNORMAL HIGH (ref 0–44)
AST: 71 U/L — ABNORMAL HIGH (ref 15–41)
Albumin: 3.8 g/dL (ref 3.5–5.0)
Alkaline Phosphatase: 65 U/L (ref 38–126)
Anion gap: 18 — ABNORMAL HIGH (ref 5–15)
BUN: 41 mg/dL — ABNORMAL HIGH (ref 6–20)
CO2: 29 mmol/L (ref 22–32)
Calcium: 9.4 mg/dL (ref 8.9–10.3)
Chloride: 80 mmol/L — ABNORMAL LOW (ref 98–111)
Creatinine, Ser: 2.89 mg/dL — ABNORMAL HIGH (ref 0.44–1.00)
GFR, Estimated: 22 mL/min — ABNORMAL LOW (ref >60)
Glucose, Bld: 120 mg/dL — ABNORMAL HIGH (ref 70–99)
Potassium: 2.6 mmol/L — CL (ref 3.5–5.1)
Sodium: 127 mmol/L — ABNORMAL LOW (ref 135–145)
Total Bilirubin: 2.9 mg/dL — ABNORMAL HIGH (ref 0.3–1.2)
Total Protein: 8.1 g/dL (ref 6.5–8.1)

## 2023-10-13 LAB — MAGNESIUM: Magnesium: 2.6 mg/dL — ABNORMAL HIGH (ref 1.7–2.4)

## 2023-10-13 LAB — CBC
HCT: 36.3 % (ref 36.0–46.0)
Hemoglobin: 13.5 g/dL (ref 12.0–15.0)
MCH: 28 pg (ref 26.0–34.0)
MCHC: 37.2 g/dL — ABNORMAL HIGH (ref 30.0–36.0)
MCV: 75.2 fL — ABNORMAL LOW (ref 80.0–100.0)
Platelets: 316 10*3/uL (ref 150–400)
RBC: 4.83 MIL/uL (ref 3.87–5.11)
RDW: 12.5 % (ref 11.5–15.5)
WBC: 6.6 10*3/uL (ref 4.0–10.5)
nRBC: 0 % (ref 0.0–0.2)

## 2023-10-13 LAB — RAPID URINE DRUG SCREEN, HOSP PERFORMED
Amphetamines: NOT DETECTED
Barbiturates: NOT DETECTED
Benzodiazepines: NOT DETECTED
Cocaine: NOT DETECTED
Opiates: NOT DETECTED
Tetrahydrocannabinol: POSITIVE — AB

## 2023-10-13 LAB — GLUCOSE, CAPILLARY: Glucose-Capillary: 139 mg/dL — ABNORMAL HIGH (ref 70–99)

## 2023-10-13 MED ORDER — SODIUM CHLORIDE 0.9 % IV BOLUS
1000.0000 mL | Freq: Once | INTRAVENOUS | Status: AC
  Administered 2023-10-13: 1000 mL via INTRAVENOUS

## 2023-10-13 MED ORDER — VITAMIN B-6 25 MG PO TABS
25.0000 mg | ORAL_TABLET | Freq: Two times a day (BID) | ORAL | Status: DC
  Administered 2023-10-13 – 2023-10-14 (×3): 25 mg via ORAL
  Filled 2023-10-13 (×3): qty 1

## 2023-10-13 MED ORDER — FOLIC ACID 5 MG/ML IJ SOLN
1.0000 mg | Freq: Every day | INTRAMUSCULAR | Status: DC
  Filled 2023-10-13 (×2): qty 0.2

## 2023-10-13 MED ORDER — LACTATED RINGERS IV SOLN: 125.0000 mL/h | INTRAVENOUS | Status: AC

## 2023-10-13 MED ORDER — SCOPOLAMINE 1 MG/3DAYS TD PT72
1.0000 | MEDICATED_PATCH | Freq: Once | TRANSDERMAL | Status: DC
  Administered 2023-10-13: 1.5 mg via TRANSDERMAL
  Filled 2023-10-13: qty 1

## 2023-10-13 MED ORDER — ACETAMINOPHEN 325 MG PO TABS: 650.0000 mg | ORAL_TABLET | ORAL | Status: DC | PRN

## 2023-10-13 MED ORDER — POTASSIUM CHLORIDE 10 MEQ/100ML IV SOLN
10.0000 meq | INTRAVENOUS | Status: AC
  Administered 2023-10-13 (×2): 10 meq via INTRAVENOUS
  Filled 2023-10-13 (×3): qty 100

## 2023-10-13 MED ORDER — POTASSIUM CHLORIDE 10 MEQ/100ML IV SOLN
10.0000 meq | INTRAVENOUS | Status: AC
  Administered 2023-10-13 (×3): 10 meq via INTRAVENOUS
  Filled 2023-10-13: qty 100

## 2023-10-13 MED ORDER — INFLUENZA VIRUS VACC SPLIT PF (FLUZONE) 0.5 ML IM SUSY
0.5000 mL | PREFILLED_SYRINGE | INTRAMUSCULAR | Status: AC
  Administered 2023-10-14: 0.5 mL via INTRAMUSCULAR
  Filled 2023-10-13: qty 0.5

## 2023-10-13 MED ORDER — PROMETHAZINE HCL 25 MG RE SUPP: 12.5000 mg | RECTAL | Status: DC | PRN

## 2023-10-13 MED ORDER — METOCLOPRAMIDE HCL 10 MG PO TABS
10.0000 mg | ORAL_TABLET | Freq: Four times a day (QID) | ORAL | Status: DC
  Administered 2023-10-13 – 2023-10-14 (×4): 10 mg via ORAL
  Filled 2023-10-13 (×4): qty 1

## 2023-10-13 MED ORDER — NIFEDIPINE ER OSMOTIC RELEASE 30 MG PO TB24
30.0000 mg | ORAL_TABLET | Freq: Every day | ORAL | Status: DC
  Administered 2023-10-14: 30 mg via ORAL
  Filled 2023-10-13: qty 1

## 2023-10-13 MED ORDER — DOCUSATE SODIUM 100 MG PO CAPS
100.0000 mg | ORAL_CAPSULE | Freq: Every day | ORAL | Status: DC
  Administered 2023-10-13: 100 mg via ORAL
  Filled 2023-10-13: qty 1

## 2023-10-13 MED ORDER — FOLIC ACID 1 MG PO TABS
1.0000 mg | ORAL_TABLET | Freq: Every day | ORAL | Status: DC
  Administered 2023-10-13 – 2023-10-14 (×2): 1 mg via ORAL
  Filled 2023-10-13 (×2): qty 1

## 2023-10-13 MED ORDER — LACTATED RINGERS IV BOLUS
1000.0000 mL | Freq: Once | INTRAVENOUS | Status: AC
  Administered 2023-10-13: 1000 mL via INTRAVENOUS

## 2023-10-13 MED ORDER — CALCIUM CARBONATE ANTACID 500 MG PO CHEW: 2.0000 | CHEWABLE_TABLET | ORAL | Status: DC | PRN

## 2023-10-13 MED ORDER — POTASSIUM CHLORIDE CRYS ER 20 MEQ PO TBCR
40.0000 meq | EXTENDED_RELEASE_TABLET | Freq: Every day | ORAL | Status: DC
  Administered 2023-10-14: 40 meq via ORAL
  Filled 2023-10-13: qty 2

## 2023-10-13 MED ORDER — PRENATAL MULTIVITAMIN CH: 1.0000 | ORAL_TABLET | Freq: Every day | ORAL | Status: DC

## 2023-10-13 MED ORDER — ONDANSETRON HCL 4 MG/2ML IJ SOLN
4.0000 mg | Freq: Once | INTRAMUSCULAR | Status: AC
  Administered 2023-10-13: 4 mg via INTRAVENOUS
  Filled 2023-10-13: qty 2

## 2023-10-13 MED ORDER — PROMETHAZINE HCL 25 MG PO TABS: 12.5000 mg | ORAL_TABLET | ORAL | Status: DC | PRN

## 2023-10-13 MED ORDER — METOCLOPRAMIDE HCL 5 MG/ML IJ SOLN
10.0000 mg | Freq: Four times a day (QID) | INTRAMUSCULAR | Status: DC
  Administered 2023-10-13: 10 mg via INTRAVENOUS
  Filled 2023-10-13 (×2): qty 2

## 2023-10-13 MED ORDER — POTASSIUM CHLORIDE IN NACL 20-0.9 MEQ/L-% IV SOLN
INTRAVENOUS | Status: DC
  Filled 2023-10-13: qty 1000

## 2023-10-13 NOTE — MAU Provider Note (Incomplete)
Chief Complaint: Nausea and Emesis  Late Entry   SUBJECTIVE HPI: Tamara Krause is a 27 y.o. [id number] at [redacted]w[redacted]d who presents to Maternity Admissions reporting N/V feeling week x 3 days.   Location: *** Quality: *** Severity: ***/10 on pain scale Duration: *** Context: *** Timing: *** Modifying factors: *** Associated signs and symptoms: ***  Past Medical History:  Diagnosis Date   Elevated serum creatinine    Hypertension    OB History  Gravida Para Term Preterm AB Living  5 3 3   1 3   SAB IAB Ectopic Multiple Live Births  1       3    # Outcome Date GA Lbr Len/2nd Weight Sex Type Anes PTL Lv  5 Current           4 Term 2023     CS-Unspec   LIV  3 Term 2021     VBAC   LIV  2 Term 2019     CS-Unspec   LIV     Birth Comments: FHT intolerance  1 SAB 2019           Past Surgical History:  Procedure Laterality Date   CESAREAN SECTION     Social History   Socioeconomic History   Marital status: Single    Spouse name: Not on file   Number of children: Not on file   Years of education: Not on file   Highest education level: Not on file  Occupational History   Not on file  Tobacco Use   Smoking status: Former   Smokeless tobacco: Never  Vaping Use   Vaping status: Never Used  Substance and Sexual Activity   Alcohol use: No   Drug use: No   Sexual activity: Yes  Other Topics Concern   Not on file  Social History Narrative   Not on file   Social Determinants of Health   Financial Resource Strain: Low Risk  (03/04/2022)   Received from [facility]   Overall Financial Resource Strain (CARDIA)    Difficulty of Paying Living Expenses: Not very hard  Food Insecurity: No Food Insecurity (03/04/2022)   Received from [facility]   Hunger Vital Sign    Worried About Running Out of Food in the Last Year: Never true    Ran Out of Food in the Last Year: Never true  Transportation Needs: No Transportation Needs (03/04/2022)   Received from [facility]   PRAPARE -  Transportation    Lack of Transportation (Medical): No    Lack of Transportation (Non-Medical): No  Physical Activity: Not on file  Stress: Not on file  Social Connections: Not on file  Intimate Partner Violence: Not on file   History reviewed. No pertinent family history. No current facility-administered medications on file prior to encounter.   Current Outpatient Medications on File Prior to Encounter  Medication Sig Dispense Refill   Prenatal 28-0.8 MG TABS Take 1 tablet by mouth daily. 30 tablet 12   doxylamine, Sleep, (UNISOM) 25 MG tablet Take 1 tablet (25 mg total) by mouth at bedtime. 30 tablet 2   pyridOXINE (VITAMIN B6) 25 MG tablet Take 1 tablet (25 mg total) by mouth in the morning, at noon, and at bedtime. 90 tablet 3   No Known Allergies  I have reviewed patient's Past Medical Hx, Surgical Hx, Family Hx, Social Hx, medications and allergies.   Review of Systems  OBJECTIVE No data found. Constitutional: Well-developed, well-nourished female in  no acute distress.  Cardiovascular: normal rate Respiratory: normal rate and effort.  GI: Abd soft, non-tender, gravid appropriate for gestational age. Pos BS x 4 MS: Extremities nontender, no edema, normal ROM Neurologic: Alert and oriented x 4.  GU: Neg CVAT.  SPECULUM EXAM: NEFG, physiologic discharge, no blood noted, cervix clean  BIMANUAL: cervix ***; uterus normal size, no adnexal tenderness or masses.  No CMT.  LAB RESULTS No results found for this or any previous visit (from the past 24 hour(s)).  IMAGING No results found.  MAU COURSE Orders Placed This Encounter  Procedures   Urinalysis, Routine w reflex microscopic -Urine, Clean Catch   CBC   Comprehensive metabolic panel   Discharge patient   Meds ordered this encounter  Medications   promethazine (PHENERGAN) 25 mg in lactated ringers 1,000 mL infusion   DISCONTD: dextrose 5% lactated ringers 1,000 mL with potassium chloride 10 mEq infusion   dextrose  5% lactated ringers 1,000 mL with potassium chloride 10 mEq infusion   ondansetron (ZOFRAN-ODT) 8 MG disintegrating tablet    Sig: Take 0.5 tablets (4 mg total) by mouth every 8 (eight) hours as needed for nausea.    Dispense:  50 tablet    Refill:  3    Order Specific Question:   Supervising Provider    Answer:   Reva Bores [[id number]]   promethazine (PHENERGAN) 25 MG tablet    Sig: Take 1 tablet (25 mg total) by mouth every 6 (six) hours as needed for nausea or vomiting.    Dispense:  30 tablet    Refill:  2    Order Specific Question:   Supervising Provider    Answer:   Reva Bores [[id number]]   scopolamine (TRANSDERM-SCOP) 1 MG/3DAYS    Sig: Place 1 patch (1.5 mg total) onto the skin every 3 (three) days.    Dispense:  10 patch    Refill:  3    Order Specific Question:   Supervising Provider    Answer:   Reva Bores [[id number]]   DISCONTD: scopolamine (TRANSDERM-SCOP) 1 MG/3DAYS 1.5 mg   ondansetron (ZOFRAN-ODT) disintegrating tablet 8 mg   NIFEdipine (PROCARDIA-XL/NIFEDICAL-XL) 30 MG 24 hr tablet    Sig: Take 1 tablet (30 mg total) by mouth daily. Can increase to twice a day as needed for symptomatic contractions    Dispense:  30 tablet    Refill:  3    Order Specific Question:   Supervising Provider    Answer:   Samara Snide    MDM  ASSESSMENT 1. Elevated serum creatinine   2. Nausea and vomiting during pregnancy   3. Ptyalism   4. Chronic hypertension complicating or reason for care during pregnancy, second trimester     PLAN Discharge home in stable condition. *** precautions  Allergies as of 10/04/2023   No Known Allergies      Medication List     TAKE these medications    NIFEdipine 30 MG 24 hr tablet Commonly known as: PROCARDIA-XL/NIFEDICAL-XL Take 1 tablet (30 mg total) by mouth daily. Can increase to twice a day as needed for symptomatic contractions   ondansetron 8 MG disintegrating tablet Commonly known as: ZOFRAN-ODT Take 0.5 tablets (4 mg  total) by mouth every 8 (eight) hours as needed for nausea. What changed:  medication strength when to take this   promethazine 25 MG tablet Commonly known as: PHENERGAN Take 1 tablet (25 mg total) by mouth every 6 (six) hours as needed  for nausea or vomiting.   scopolamine 1 MG/3DAYS Commonly known as: TRANSDERM-SCOP Place 1 patch (1.5 mg total) onto the skin every 3 (three) days.       ASK your doctor about these medications    doxylamine (Sleep) 25 MG tablet Commonly known as: UNISOM Take 1 tablet (25 mg total) by mouth at bedtime.   Prenatal 28-0.8 MG Tabs Take 1 tablet by mouth daily.   pyridOXINE 25 MG tablet Commonly known as: VITAMIN B6 Take 1 tablet (25 mg total) by mouth in the morning, at noon, and at bedtime.         Katrinka Blazing, [place], CNM 10/13/2023  8:19 AM

## 2023-10-13 NOTE — MAU Provider Note (Addendum)
Chief Complaint: Loss of Consciousness and Nausea   Event Date/Time   First Provider Initiated Contact with Patient 10/13/23 0631     SUBJECTIVE HPI: Tamara Krause is a 27 y.o. [id number] at [redacted]w[redacted]d who presents to maternity Admissions by EMS reporting 2 witnessed syncopal episodes and ongoing, severe nausea and vomiting of pregnancy.  States she felt hot and dizzy before passing out.  States she took Zofran and Phenergan in the past day but does remember exactly what time.  Had been on scopolamine patch and found that it was helping with sensation of having mucus in her throat but has not been able to get prescription filled.  Associated signs and symptoms: Negative for fever, chills, vaginal bleeding, abdominal pain, altered mental status.  Past Medical History:  Diagnosis Date   Elevated serum creatinine    Hypertension    OB History  Gravida Para Term Preterm AB Living  5 3 3   1 3   SAB IAB Ectopic Multiple Live Births  1       3    # Outcome Date GA Lbr Len/2nd Weight Sex Type Anes PTL Lv  5 Current           4 Term 2023     CS-Unspec   LIV  3 Term 2021     VBAC   LIV  2 Term 2019     CS-Unspec   LIV     Birth Comments: FHT intolerance  1 SAB 2019           Past Surgical History:  Procedure Laterality Date   CESAREAN SECTION     Social History   Socioeconomic History   Marital status: Single    Spouse name: Not on file   Number of children: Not on file   Years of education: Not on file   Highest education level: Not on file  Occupational History   Not on file  Tobacco Use   Smoking status: Former   Smokeless tobacco: Never  Vaping Use   Vaping status: Never Used  Substance and Sexual Activity   Alcohol use: No   Drug use: No   Sexual activity: Yes  Other Topics Concern   Not on file  Social History Narrative   Not on file   Social Determinants of Health   Financial Resource Strain: Low Risk  (03/04/2022)   Received from [facility]   Overall Financial  Resource Strain (CARDIA)    Difficulty of Paying Living Expenses: Not very hard  Food Insecurity: No Food Insecurity (03/04/2022)   Received from [facility]   Hunger Vital Sign    Worried About Running Out of Food in the Last Year: Never true    Ran Out of Food in the Last Year: Never true  Transportation Needs: No Transportation Needs (03/04/2022)   Received from [facility]   PRAPARE - Transportation    Lack of Transportation (Medical): No    Lack of Transportation (Non-Medical): No  Physical Activity: Not on file  Stress: Not on file  Social Connections: Not on file  Intimate Partner Violence: Not on file   History reviewed. No pertinent family history. No current facility-administered medications on file prior to encounter.   Current Outpatient Medications on File Prior to Encounter  Medication Sig Dispense Refill   NIFEdipine (PROCARDIA-XL/NIFEDICAL-XL) 30 MG 24 hr tablet Take 1 tablet (30 mg total) by mouth daily. Can increase to twice a day as needed for symptomatic contractions 30  tablet 3   ondansetron (ZOFRAN-ODT) 8 MG disintegrating tablet Take 0.5 tablets (4 mg total) by mouth every 8 (eight) hours as needed for nausea. 50 tablet 3   Prenatal 28-0.8 MG TABS Take 1 tablet by mouth daily. 30 tablet 12   promethazine (PHENERGAN) 25 MG tablet Take 1 tablet (25 mg total) by mouth every 6 (six) hours as needed for nausea or vomiting. 30 tablet 2   scopolamine (TRANSDERM-SCOP) 1 MG/3DAYS Place 1 patch (1.5 mg total) onto the skin every 3 (three) days. 10 patch 3   doxylamine, Sleep, (UNISOM) 25 MG tablet Take 1 tablet (25 mg total) by mouth at bedtime. 30 tablet 2   pyridOXINE (VITAMIN B6) 25 MG tablet Take 1 tablet (25 mg total) by mouth in the morning, at noon, and at bedtime. 90 tablet 3   No Known Allergies  I have reviewed patient's Past Medical Hx, Surgical Hx, Family Hx, Social Hx, medications and allergies.   Review of Systems  Constitutional:  Negative for  appetite change, chills and fever.  Gastrointestinal:  Positive for nausea and vomiting. Negative for abdominal pain and diarrhea.  Genitourinary:  Negative for vaginal bleeding.  Neurological:  Positive for dizziness and syncope. Negative for seizures, facial asymmetry, speech difficulty and headaches.    OBJECTIVE Patient Vitals for the past 24 hrs:  BP Temp Temp src Pulse Resp SpO2 Height Weight  10/13/23 0556 111/74 97.6 F (36.4 C) Oral 93 16 99 % 5\' 6"  (1.676 m) 62.6 kg   Constitutional: Well-developed, well-nourished female in no acute distress.  Drowsy, weak-appearing.  Skin: No cyanosis or pallor Cardiovascular: normal rate Respiratory: normal rate and effort.  GI: Deferred MS: Extremities nontender, no edema, normal ROM Neurologic: Alert and oriented x 4.  GU: Deferred  Fetal heart rate 155 by Doppler  LAB RESULTS Results for orders placed or performed during the hospital encounter of 10/13/23 (from the past 24 hour(s))  CBC     Status: Abnormal   Collection Time: 10/13/23  6:21 AM  Result Value Ref Range   WBC 6.6 4.0 - 10.5 K/uL   RBC 4.83 3.87 - 5.11 MIL/uL   Hemoglobin 13.5 12.0 - 15.0 g/dL   HCT 16.1 09.6 - 04.5 %   MCV 75.2 (L) 80.0 - 100.0 fL   MCH 28.0 26.0 - 34.0 pg   MCHC 37.2 (H) 30.0 - 36.0 g/dL   RDW 40.9 81.1 - 91.4 %   Platelets 316 150 - 400 K/uL   nRBC 0.0 0.0 - 0.2 %  Comprehensive metabolic panel     Status: Abnormal   Collection Time: 10/13/23  6:21 AM  Result Value Ref Range   Sodium 127 (L) 135 - 145 mmol/L   Potassium 2.6 (LL) 3.5 - 5.1 mmol/L   Chloride 80 (L) 98 - 111 mmol/L   CO2 29 22 - 32 mmol/L   Glucose, Bld 120 (H) 70 - 99 mg/dL   BUN 41 (H) 6 - 20 mg/dL   Creatinine, Ser 7.82 (H) 0.44 - 1.00 mg/dL   Calcium 9.4 8.9 - 95.6 mg/dL   Total Protein 8.1 6.5 - 8.1 g/dL   Albumin 3.8 3.5 - 5.0 g/dL   AST 71 (H) 15 - 41 U/L   ALT 108 (H) 0 - 44 U/L   Alkaline Phosphatase 65 38 - 126 U/L   Total Bilirubin 2.9 (H) 0.3 - 1.2 mg/dL    GFR, Estimated 22 (L) >60 mL/min   Anion gap 18 (H) 5 -  15  Glucose, capillary     Status: Abnormal   Collection Time: 10/13/23  6:32 AM  Result Value Ref Range   Glucose-Capillary 139 (H) 70 - 99 mg/dL  Urinalysis, Routine w reflex microscopic -Urine, Clean Catch     Status: Abnormal   Collection Time: 10/13/23  7:12 AM  Result Value Ref Range   Color, Urine AMBER (A) YELLOW   APPearance CLOUDY (A) CLEAR   Specific Gravity, Urine 1.017 1.005 - 1.030   pH 5.0 5.0 - 8.0   Glucose, UA NEGATIVE NEGATIVE mg/dL   Hgb urine dipstick NEGATIVE NEGATIVE   Bilirubin Urine SMALL (A) NEGATIVE   Ketones, ur NEGATIVE NEGATIVE mg/dL   Protein, ur 30 (A) NEGATIVE mg/dL   Nitrite NEGATIVE NEGATIVE   Leukocytes,Ua NEGATIVE NEGATIVE   RBC / HPF 0-5 0 - 5 RBC/hpf   WBC, UA 11-20 0 - 5 WBC/hpf   Bacteria, UA RARE (A) NONE SEEN   Squamous Epithelial / HPF 11-20 0 - 5 /HPF   Mucus PRESENT    Hyaline Casts, UA PRESENT     MAU COURSE Orders Placed This Encounter  Procedures   CBC   Comprehensive metabolic panel   Urinalysis, Routine w reflex microscopic -Urine, Clean Catch   Glucose, capillary   Orthostatic vital signs   EKG 12-Lead   EKG 12-Lead   Meds ordered this encounter  Medications   lactated ringers bolus 1,000 mL   ondansetron (ZOFRAN) injection 4 mg   scopolamine (TRANSDERM-SCOP) 1 MG/3DAYS 1.5 mg   potassium chloride 10 mEq in 100 mL IVPB   sodium chloride 0.9 % bolus 1,000 mL   Called Dr. Jolayne Panther and notified her of critical value of potassium 2.6 and serum creatinine 2.89.  Recommends IV potassium repletion.  Consulted with pharmacist due to concern with potassium repletion and patient with decreased kidney function.  Okay to run 2 runs of K and recheck potassium.  Recommends changing IV fluids to saline due to hyponatremia.  Called cardiology due to preliminary EKG showing incomplete right bundle blanch block and patient severe hypokalemia.  7:46: No call back from Card Master.  Called [facility] attending.   Dr. Antoine Poche returned call. Reviewing EKG--> normal  0800: Care of pt turned over to [facility]   ASSESSMENT 1. Hypokalemia due to excessive gastrointestinal loss of potassium   2. AKI (acute kidney injury) (HCC)   3. Transaminitis   4. Hyperemesis gravidarum before end of [redacted] week gestation with electrolyte imbalance   5. [redacted] weeks gestation of pregnancy   6. Dehydration with hyponatremia      Katrinka Blazing [place], CNM 10/13/2023  8:27 AM  Reassessment (9:13 AM) Patient admitted.

## 2023-10-13 NOTE — MAU Note (Signed)
CRITICAL VALUE STICKER  CRITICAL VALUE: K+ 2.6  RECEIVER (on-site recipient of call):  DATE & TIME NOTIFIED: 725  MESSENGER (representative from lab):  MD NOTIFIED: V.Smith,CNM  TIME OF NOTIFICATION:725  RESPONSE: K+ rider ordered

## 2023-10-13 NOTE — H&P (Cosign Needed Addendum)
SUBJECTIVE HPI: Tamara Krause is a 27 y.o. [id number] at [redacted]w[redacted]d who presents to maternity Admissions by EMS reporting 2 witnessed syncopal episodes and ongoing, severe nausea and vomiting of pregnancy.  States she felt hot and dizzy before passing out.  States she took Zofran and Phenergan in the past day but does remember exactly what time.  Had been on scopolamine patch and found that it was helping with sensation of having mucus in her throat but has not been able to get prescription filled.   Associated signs and symptoms: Negative for fever, chills, vaginal bleeding, abdominal pain, altered mental status.  Of note patient has not yet started [facility].      Past Medical History:  Diagnosis Date   Elevated serum creatinine     Hypertension                          OB History  Gravida Para Term Preterm AB Living  5 3 3   1 3   SAB IAB Ectopic Multiple Live Births     1       3        # Outcome Date GA Lbr Len/2nd Weight Sex Type Anes PTL Lv  5 Current                    4 Term 2023         CS-Unspec     LIV  3 Term 2021         VBAC     LIV  2 Term 2019         CS-Unspec     LIV     Birth Comments: FHT intolerance  1 SAB 2019                         Past Surgical History:  Procedure Laterality Date   CESAREAN SECTION            Social History         Socioeconomic History   Marital status: Single      Spouse name: Not on file   Number of children: Not on file   Years of education: Not on file   Highest education level: Not on file  Occupational History   Not on file  Tobacco Use   Smoking status: Former   Smokeless tobacco: Never  Vaping Use   Vaping status: Never Used  Substance and Sexual Activity   Alcohol use: No   Drug use: No   Sexual activity: Yes  Other Topics Concern   Not on file  Social History Narrative   Not on file    Social Determinants of Health        Financial Resource Strain: Low Risk  (03/04/2022)    Received from [facility]     Overall Financial Resource Strain (CARDIA)     Difficulty of Paying Living Expenses: Not very hard  Food Insecurity: No Food Insecurity (03/04/2022)    Received from [facility]    Hunger Vital Sign     Worried About Running Out of Food in the Last Year: Never true     Ran Out of Food in the Last Year: Never true  Transportation Needs: No Transportation Needs (03/04/2022)    Received from [facility]     Lack of Transportation (Medical): No  Lack of Transportation (Non-Medical): No  Physical Activity: Not on file  Stress: Not on file  Social Connections: Not on file  Intimate Partner Violence: Not on file    History reviewed. No pertinent family history.     Medications Ordered Prior to Encounter  No current facility-administered medications on file prior to encounter.          Current Outpatient Medications on File Prior to Encounter  Medication Sig Dispense Refill   NIFEdipine (PROCARDIA-XL/NIFEDICAL-XL) 30 MG 24 hr tablet Take 1 tablet (30 mg total) by mouth daily. Can increase to twice a day as needed for symptomatic contractions 30 tablet 3   ondansetron (ZOFRAN-ODT) 8 MG disintegrating tablet Take 0.5 tablets (4 mg total) by mouth every 8 (eight) hours as needed for nausea. 50 tablet 3   Prenatal 28-0.8 MG TABS Take 1 tablet by mouth daily. 30 tablet 12   promethazine (PHENERGAN) 25 MG tablet Take 1 tablet (25 mg total) by mouth every 6 (six) hours as needed for nausea or vomiting. 30 tablet 2   scopolamine (TRANSDERM-SCOP) 1 MG/3DAYS Place 1 patch (1.5 mg total) onto the skin every 3 (three) days. 10 patch 3   doxylamine, Sleep, (UNISOM) 25 MG tablet Take 1 tablet (25 mg total) by mouth at bedtime. 30 tablet 2   pyridOXINE (VITAMIN B6) 25 MG tablet Take 1 tablet (25 mg total) by mouth in the morning, at noon, and at bedtime. 90 tablet 3      Allergies  No Known Allergies     I have reviewed patient's Past Medical Hx, Surgical Hx, Family  Hx, Social Hx, medications and allergies.    Review of Systems  Constitutional:  Negative for appetite change, chills and fever.  Gastrointestinal:  Positive for nausea and vomiting. Negative for abdominal pain and diarrhea.  Genitourinary:  Negative for vaginal bleeding.  Neurological:  Positive for dizziness and syncope. Negative for seizures, facial asymmetry, speech difficulty and headaches.      OBJECTIVE Patient Vitals for the past 24 hrs:   BP Temp Temp src Pulse Resp SpO2 Height Weight  10/13/23 0556 111/74 97.6 F (36.4 C) Oral 93 16 99 % 5\' 6"  (1.676 m) 62.6 kg    Constitutional: Well-developed, well-nourished female in no acute distress.  Drowsy, weak-appearing.  Skin: No cyanosis or pallor Cardiovascular: normal rate Respiratory: normal rate and effort.  GI: Deferred MS: Extremities nontender, no edema, normal ROM Neurologic: Alert and oriented x 4.  GU: Deferred   Fetal heart rate 155 by Doppler   LAB RESULTS Lab Results Last 24 Hours       Results for orders placed or performed during the hospital encounter of 10/13/23 (from the past 24 hour(s))  CBC     Status: Abnormal    Collection Time: 10/13/23  6:21 AM  Result Value Ref Range    WBC 6.6 4.0 - 10.5 K/uL    RBC 4.83 3.87 - 5.11 MIL/uL    Hemoglobin 13.5 12.0 - 15.0 g/dL    HCT 21.3 08.6 - 57.8 %    MCV 75.2 (L) 80.0 - 100.0 fL    MCH 28.0 26.0 - 34.0 pg    MCHC 37.2 (H) 30.0 - 36.0 g/dL    RDW 46.9 62.9 - 52.8 %    Platelets 316 150 - 400 K/uL    nRBC 0.0 0.0 - 0.2 %  Comprehensive metabolic panel     Status: Abnormal    Collection Time: 10/13/23  6:21 AM  Result Value Ref Range    Sodium 127 (L) 135 - 145 mmol/L    Potassium 2.6 (LL) 3.5 - 5.1 mmol/L    Chloride 80 (L) 98 - 111 mmol/L    CO2 29 22 - 32 mmol/L    Glucose, Bld 120 (H) 70 - 99 mg/dL    BUN 41 (H) 6 - 20 mg/dL    Creatinine, Ser 2.44 (H) 0.44 - 1.00 mg/dL    Calcium 9.4 8.9 - 01.0 mg/dL    Total Protein 8.1 6.5 - 8.1 g/dL     Albumin 3.8 3.5 - 5.0 g/dL    AST 71 (H) 15 - 41 U/L    ALT 108 (H) 0 - 44 U/L    Alkaline Phosphatase 65 38 - 126 U/L    Total Bilirubin 2.9 (H) 0.3 - 1.2 mg/dL    GFR, Estimated 22 (L) >60 mL/min    Anion gap 18 (H) 5 - 15  Glucose, capillary     Status: Abnormal    Collection Time: 10/13/23  6:32 AM  Result Value Ref Range    Glucose-Capillary 139 (H) 70 - 99 mg/dL  Urinalysis, Routine w reflex microscopic -Urine, Clean Catch     Status: Abnormal    Collection Time: 10/13/23  7:12 AM  Result Value Ref Range    Color, Urine AMBER (A) YELLOW    APPearance CLOUDY (A) CLEAR    Specific Gravity, Urine 1.017 1.005 - 1.030    pH 5.0 5.0 - 8.0    Glucose, UA NEGATIVE NEGATIVE mg/dL    Hgb urine dipstick NEGATIVE NEGATIVE    Bilirubin Urine SMALL (A) NEGATIVE    Ketones, ur NEGATIVE NEGATIVE mg/dL    Protein, ur 30 (A) NEGATIVE mg/dL    Nitrite NEGATIVE NEGATIVE    Leukocytes,Ua NEGATIVE NEGATIVE    RBC / HPF 0-5 0 - 5 RBC/hpf    WBC, UA 11-20 0 - 5 WBC/hpf    Bacteria, UA RARE (A) NONE SEEN    Squamous Epithelial / HPF 11-20 0 - 5 /HPF    Mucus PRESENT      Hyaline Casts, UA PRESENT          MAU COURSE    Orders Placed This Encounter  Procedures   CBC   Comprehensive metabolic panel   Urinalysis, Routine w reflex microscopic -Urine, Clean Catch   Glucose, capillary   Orthostatic vital signs   EKG 12-Lead   EKG 12-Lead       Meds ordered this encounter  Medications   lactated ringers bolus 1,000 mL   ondansetron (ZOFRAN) injection 4 mg   scopolamine (TRANSDERM-SCOP) 1 MG/3DAYS 1.5 mg   potassium chloride 10 mEq in 100 mL IVPB   sodium chloride 0.9 % bolus 1,000 mL    Called Dr. Jolayne Panther and notified her of critical value of potassium 2.6 and serum creatinine 2.89.  Recommends IV potassium repletion.  Consulted with pharmacist due to concern with potassium repletion and patient with decreased kidney function.  Okay to run 2 runs of K and recheck potassium.  Recommends  changing IV fluids to saline due to hyponatremia.  Called cardiology due to preliminary EKG showing incomplete right bundle blanch block and patient severe hypokalemia.   7:46: No call back from Card Master. Called [facility] attending.    Dr. Antoine Poche returned call. Reviewing EKG--> normal   0800: Care of pt turned over to [facility]    ASSESSMENT 1. Hypokalemia due to excessive gastrointestinal loss  of potassium   2. AKI (acute kidney injury) (HCC)   3. Transaminitis   4. Hyperemesis gravidarum before end of [redacted] week gestation with electrolyte imbalance   5. [redacted] weeks gestation of pregnancy   6. Dehydration with hyponatremia         Katrinka Blazing [place], CNM 10/13/2023  8:27 AM        Reassessment (9:08 AM) -Dr. Katharine Look contacted regarding patient labs and status. *Agreeable with admission. *Will place admit orders. -Patient updated on POC. No questions.  Cherre Robins MSN, CNM Advanced Practice Provider, [facility] [employer]

## 2023-10-13 NOTE — MAU Note (Addendum)
.  Tamara Krause is a 27 y.o. at [redacted]w[redacted]d here in MAU reporting: here by EMS - pt states she got hot and felt like she couldn't breath, tried sitting in front of a fan to cool down -- reports feeling dizzy and passing out twice - states fiance witnessed first time and sister in law witness the second time. Denies pain, VB, or LOF. Reports N/V throughout the day - "feels like mucous is stuck in my throat". Reports was seen in the ED previously for N/V and was prescribed nausea medication - started taking 3 days ago. States she took her medications earlier today, but unsure of what time.   Onset of complaint: pt unsure of time  Pain score: 0 Vitals:   10/13/23 0556  BP: 111/74  Pulse: 93  Resp: 16  Temp: 97.6 F (36.4 C)  SpO2: 99%     FHT:155 Lab orders placed from triage:  UA

## 2023-10-14 ENCOUNTER — Other Ambulatory Visit (HOSPITAL_COMMUNITY): Payer: Self-pay

## 2023-10-14 DIAGNOSIS — O210 Mild hyperemesis gravidarum: Secondary | ICD-10-CM | POA: Diagnosis not present

## 2023-10-14 DIAGNOSIS — Z3A17 17 weeks gestation of pregnancy: Secondary | ICD-10-CM | POA: Diagnosis not present

## 2023-10-14 LAB — COMPREHENSIVE METABOLIC PANEL
ALT: 74 U/L — ABNORMAL HIGH (ref 0–44)
AST: 38 U/L (ref 15–41)
Albumin: 2.7 g/dL — ABNORMAL LOW (ref 3.5–5.0)
Alkaline Phosphatase: 49 U/L (ref 38–126)
Anion gap: 10 (ref 5–15)
BUN: 20 mg/dL (ref 6–20)
CO2: 29 mmol/L (ref 22–32)
Calcium: 8.7 mg/dL — ABNORMAL LOW (ref 8.9–10.3)
Chloride: 93 mmol/L — ABNORMAL LOW (ref 98–111)
Creatinine, Ser: 1.35 mg/dL — ABNORMAL HIGH (ref 0.44–1.00)
GFR, Estimated: 55 mL/min — ABNORMAL LOW (ref >60)
Glucose, Bld: 97 mg/dL (ref 70–99)
Potassium: 3.1 mmol/L — ABNORMAL LOW (ref 3.5–5.1)
Sodium: 132 mmol/L — ABNORMAL LOW (ref 135–145)
Total Bilirubin: 0.9 mg/dL (ref 0.3–1.2)
Total Protein: 5.7 g/dL — ABNORMAL LOW (ref 6.5–8.1)

## 2023-10-14 LAB — MAGNESIUM: Magnesium: 2.2 mg/dL (ref 1.7–2.4)

## 2023-10-14 MED ORDER — PYRIDOXINE HCL 50 MG PO TABS
25.0000 mg | ORAL_TABLET | Freq: Three times a day (TID) | ORAL | 0 refills | Status: AC
  Filled 2023-10-14: qty 45, 30d supply, fill #0

## 2023-10-14 MED ORDER — POTASSIUM CHLORIDE 10 MEQ/100ML IV SOLN
10.0000 meq | INTRAVENOUS | Status: AC
  Administered 2023-10-14 (×2): 10 meq via INTRAVENOUS
  Filled 2023-10-14 (×2): qty 100

## 2023-10-14 MED ORDER — POTASSIUM CHLORIDE CRYS ER 20 MEQ PO TBCR
20.0000 meq | EXTENDED_RELEASE_TABLET | Freq: Every day | ORAL | 0 refills | Status: DC
  Filled 2023-10-14: qty 30, 30d supply, fill #0

## 2023-10-14 MED ORDER — ONDANSETRON 4 MG PO TBDP
4.0000 mg | ORAL_TABLET | Freq: Three times a day (TID) | ORAL | 0 refills | Status: AC | PRN
Start: 2023-10-14 — End: 2023-11-17
  Filled 2023-10-14: qty 102, 34d supply, fill #0

## 2023-10-14 MED ORDER — SCOPOLAMINE 1 MG/3DAYS TD PT72
1.0000 | MEDICATED_PATCH | TRANSDERMAL | 0 refills | Status: AC
Start: 2023-10-14 — End: 2023-11-13
  Filled 2023-10-14: qty 10, 30d supply, fill #0

## 2023-10-14 MED ORDER — COMPLETENATE 29-1 MG PO CHEW: 1.0000 | CHEWABLE_TABLET | Freq: Every day | ORAL | Status: DC

## 2023-10-14 MED ORDER — METOCLOPRAMIDE HCL 10 MG PO TABS
10.0000 mg | ORAL_TABLET | Freq: Three times a day (TID) | ORAL | 0 refills | Status: DC
  Filled 2023-10-14: qty 102, 34d supply, fill #0

## 2023-10-14 MED ORDER — PROMETHAZINE HCL 25 MG PO TABS
25.0000 mg | ORAL_TABLET | Freq: Four times a day (QID) | ORAL | 2 refills | Status: DC | PRN
Start: 2023-10-14 — End: 2024-03-07
  Filled 2023-10-14: qty 30, 8d supply, fill #0

## 2023-10-14 MED ORDER — ACETAMINOPHEN 325 MG PO TABS
650.0000 mg | ORAL_TABLET | Freq: Four times a day (QID) | ORAL | 0 refills | Status: DC | PRN
  Filled 2023-10-14: qty 30, 4d supply, fill #0

## 2023-10-14 NOTE — Progress Notes (Signed)
   10/14/23 1230  Departure Condition  Departure Condition Good  Mobility at [facility]  Patient/Caregiver Teaching Teach Back Method Used;Discharge instructions reviewed;Prescriptions reviewed;Follow-up care reviewed;Pain management discussed;Medications discussed;Patient/caregiver verbalized understanding  Departure Mode With significant other  Was procedural sedation performed on this patient during this visit? No   Patient alert and oriented x4, VS and pain stable.

## 2023-10-14 NOTE — Discharge Summary (Signed)
Physician Discharge Summary  Patient ID: Tamara Krause MRN: [id number] DOB/AGE: February 27, 1996 27 y.o.  Admit date: 10/13/2023 Discharge date: 10/14/2023  Admission Diagnoses:  Discharge Diagnoses:  Principal Problem:   Hyperemesis arising during pregnancy   Discharged Condition: stable  Hospital Course: 26yo [id number] @ [redacted]w[redacted]d admitted due to hyper emesis.  Patient also noted to have significant hypokalemia and AKI.  She was treated with IV fluids, potassium and antiemetics.  She had considerable improvement of her symptoms and was able to tolerate p.o.  She was discharged home on oral antiemetics with plan for close outpatient follow-up  Consults: None  Significant Diagnostic Studies: labs:  Results for orders placed or performed during the hospital encounter of 10/13/23 (from the past 24 hour(s))  Magnesium     Status: None   Collection Time: 10/14/23  4:33 AM  Result Value Ref Range   Magnesium 2.2 1.7 - 2.4 mg/dL  Comprehensive metabolic panel     Status: Abnormal   Collection Time: 10/14/23  4:33 AM  Result Value Ref Range   Sodium 132 (L) 135 - 145 mmol/L   Potassium 3.1 (L) 3.5 - 5.1 mmol/L   Chloride 93 (L) 98 - 111 mmol/L   CO2 29 22 - 32 mmol/L   Glucose, Bld 97 70 - 99 mg/dL   BUN 20 6 - 20 mg/dL   Creatinine, Ser 1.47 (H) 0.44 - 1.00 mg/dL   Calcium 8.7 (L) 8.9 - 10.3 mg/dL   Total Protein 5.7 (L) 6.5 - 8.1 g/dL   Albumin 2.7 (L) 3.5 - 5.0 g/dL   AST 38 15 - 41 U/L   ALT 74 (H) 0 - 44 U/L   Alkaline Phosphatase 49 38 - 126 U/L   Total Bilirubin 0.9 0.3 - 1.2 mg/dL   GFR, Estimated 55 (L) >60 mL/min   Anion gap 10 5 - 15     Treatments: IV hydration and anti-emetics  Discharge Exam: Blood pressure 134/63, pulse 77, temperature 97.6 F (36.4 C), temperature source Oral, resp. rate 18, height 5\' 6"  (1.676 m), weight 66.6 kg, last menstrual period 06/13/2023, SpO2 100%, unknown if currently breastfeeding. General appearance: alert, cooperative, and no  distress Lungs: CTAB Cardio: regular rate and rhythm GI: Soft and nontender, no rebound no guarding Extremities: no edema, no calf tenderness bilaterally Skin: warm and dry Neurologic: Grossly normal  Disposition: Discharge disposition: 01-Home or Self Care       Discharge Instructions     meds to beds pharmacy consult ([facility] ONLY)   Complete by: As directed       Allergies as of 10/14/2023   No Known Allergies      Medication List     TAKE these medications    acetaminophen 325 MG tablet Commonly known as: TYLENOL Take 2 tablets (650 mg total) by mouth every 6 (six) hours as needed (for pain scale < 4  OR  temperature  >/=  100.5 F).   doxylamine (Sleep) 25 MG tablet Commonly known as: UNISOM Take 1 tablet (25 mg total) by mouth at bedtime.   metoCLOPramide 10 MG tablet Commonly known as: REGLAN Take 1 tablet (10 mg total) by mouth every 8 (eight) hours.   NIFEdipine 30 MG 24 hr tablet Commonly known as: PROCARDIA-XL/NIFEDICAL-XL Take 1 tablet (30 mg total) by mouth daily. Can increase to twice a day as needed for symptomatic contractions   ondansetron 4 MG disintegrating tablet Commonly known as: ZOFRAN-ODT Take 1 tablet (4 mg total) by mouth every 8 (  eight) hours as needed for nausea. Take 4 hours after reglan if still nauseous What changed:  medication strength additional instructions   potassium chloride SA 20 MEQ tablet Commonly known as: KLOR-CON M Take 1 tablet (20 mEq total) by mouth daily. Start taking on: October 15, 2023   Prenatal 28-0.8 MG Tabs Take 1 tablet by mouth daily.   promethazine 25 MG tablet Commonly known as: PHENERGAN Take 1 tablet (25 mg total) by mouth every 6 (six) hours as needed for nausea or vomiting. Take 4 hours after reglan or ondansetron if still nauseous What changed: additional instructions   Transderm-Scop 1 MG/3DAYS Generic drug: scopolamine Place 1 patch  onto the skin every 3 (three) days.    Vitamin B6 50 MG Tabs Take 0.5 tablets (25 mg total) by mouth in the morning, at noon, and at bedtime. What changed: medication strength        Follow-up Information     [facility]. Go in 2 week(s).   Specialty: Obstetrics and Gynecology Why: The office may call you AND I would encourage you to call and confirm an appointment in 1-2 weeks. Contact information: [address] [place] [address] [phone]                Signed: Sharon Seller 10/14/2023, 2:38 PM

## 2023-10-17 NOTE — Plan of Care (Signed)
[facility] Tonsillectomy/Adenoidectomy, Postoperative PEDS care plan entered in error.

## 2023-10-22 NOTE — Plan of Care (Signed)
[facility] Tonsillectomy/Adenoidectomy, Postoperative PEDS care plan entered in error.

## 2023-10-28 ENCOUNTER — Encounter: Payer: Self-pay | Admitting: Family Medicine

## 2023-10-28 ENCOUNTER — Other Ambulatory Visit: Payer: Self-pay

## 2023-10-28 ENCOUNTER — Ambulatory Visit: Payer: Medicaid Other | Admitting: Family Medicine

## 2023-10-28 VITALS — BP 136/84 | HR 87 | Wt 149.4 lb

## 2023-10-28 DIAGNOSIS — O09299 Supervision of pregnancy with other poor reproductive or obstetric history, unspecified trimester: Secondary | ICD-10-CM

## 2023-10-28 DIAGNOSIS — N179 Acute kidney failure, unspecified: Secondary | ICD-10-CM | POA: Diagnosis not present

## 2023-10-28 DIAGNOSIS — O0992 Supervision of high risk pregnancy, unspecified, second trimester: Secondary | ICD-10-CM

## 2023-10-28 DIAGNOSIS — E876 Hypokalemia: Secondary | ICD-10-CM

## 2023-10-28 DIAGNOSIS — O10912 Unspecified pre-existing hypertension complicating pregnancy, second trimester: Secondary | ICD-10-CM

## 2023-10-28 DIAGNOSIS — O34219 Maternal care for unspecified type scar from previous cesarean delivery: Secondary | ICD-10-CM

## 2023-10-28 DIAGNOSIS — O09292 Supervision of pregnancy with other poor reproductive or obstetric history, second trimester: Secondary | ICD-10-CM

## 2023-10-28 DIAGNOSIS — Z3A19 19 weeks gestation of pregnancy: Secondary | ICD-10-CM

## 2023-10-28 DIAGNOSIS — R569 Unspecified convulsions: Secondary | ICD-10-CM

## 2023-10-28 NOTE — Progress Notes (Signed)
Subjective:   Tamara Krause is a 27 y.o. [id number] at [redacted]w[redacted]d by LMP being seen today for her first obstetrical visit.  Her obstetrical history is significant for  chronic HTN, seizure like activity . Patient does not intend to breast feed. Pregnancy history fully reviewed.  Patient reports no bleeding, no contractions, no cramping, and no leaking.  HISTORY: OB History  Gravida Para Term Preterm AB Living  5 3 3  0 1 3  SAB IAB Ectopic Multiple Live Births  1 0 0 0 3    # Outcome Date GA Lbr Len/2nd Weight Sex Type Anes PTL Lv  5 Current           4 Term 2023     CS-Unspec   LIV  3 Term 2021     VBAC   LIV  2 Term 2019     CS-Unspec   LIV     Birth Comments: FHT intolerance  1 SAB 2019           Last pap smear was  06/07/2018 and was normal Past Medical History:  Diagnosis Date   Elevated serum creatinine    Hypertension    Past Surgical History:  Procedure Laterality Date   CESAREAN SECTION     History reviewed. No pertinent family history. Social History   Tobacco Use   Smoking status: Former    Current packs/day: 0.00    Types: Cigarettes    Quit date: 08/28/2023    Years since quitting: 0.1   Smokeless tobacco: Never  Vaping Use   Vaping status: Never Used  Substance Use Topics   Alcohol use: No   Drug use: No   No Known Allergies Current Outpatient Medications on File Prior to Visit  Medication Sig Dispense Refill   acetaminophen (TYLENOL) 325 MG tablet Take 2 tablets (650 mg total) by mouth every 6 (six) hours as needed (for pain scale < 4  OR  temperature  >/=  100.5 F). 30 tablet 0   metoCLOPramide (REGLAN) 10 MG tablet Take 1 tablet (10 mg total) by mouth every 8 (eight) hours. 102 tablet 0   NIFEdipine (PROCARDIA-XL/NIFEDICAL-XL) 30 MG 24 hr tablet Take 1 tablet (30 mg total) by mouth daily. Can increase to twice a day as needed for symptomatic contractions 30 tablet 3   ondansetron (ZOFRAN-ODT) 4 MG disintegrating tablet Take 1 tablet (4 mg total) by  mouth every 8 (eight) hours as needed for nausea. Take 4 hours after reglan if still nauseous 102 tablet 0   potassium chloride SA (KLOR-CON M) 20 MEQ tablet Take 1 tablet (20 mEq total) by mouth daily. 30 tablet 0   Prenatal 28-0.8 MG TABS Take 1 tablet by mouth daily. 30 tablet 12   promethazine (PHENERGAN) 25 MG tablet Take 1 tablet (25 mg total) by mouth every 6 (six) hours as needed for nausea or vomiting. Take 4 hours after reglan or ondansetron if still nauseous 30 tablet 2   pyridOXINE (B-6) 50 MG tablet Take 0.5 tablets (25 mg total) by mouth in the morning, at noon, and at bedtime. 45 tablet 0   scopolamine (TRANSDERM-SCOP) 1 MG/3DAYS Place 1 patch  onto the skin every 3 (three) days. 10 patch 0   doxylamine, Sleep, (UNISOM) 25 MG tablet Take 1 tablet (25 mg total) by mouth at bedtime. (Patient not taking: Reported on 10/28/2023) 30 tablet 2   No current facility-administered medications on file prior to visit.     Exam  Vitals:   10/28/23 1033  BP: 136/84  Pulse: 87  Weight: 149 lb 6.4 oz (67.8 kg)   Fetal Heart Rate (bpm): 159  System: General: well-developed, well-nourished female in no acute distress   Skin: normal coloration and turgor, no rashes   Neurologic: oriented, normal, negative, normal mood   Extremities: normal strength, tone, and muscle mass, ROM of all joints is normal   HEENT PERRLA, extraocular movement intact and sclera clear, anicteric   Mouth/Teeth mucous membranes moist, pharynx normal without lesions and dental hygiene good   Neck supple and no masses   Cardiovascular: regular rate and rhythm   Respiratory:  no respiratory distress, normal breath sounds   Abdomen: soft, non-tender; bowel sounds normal; no masses,  no organomegaly     Assessment:   Pregnancy: [id number] Patient Active Problem List   Diagnosis Date Noted   Hyperemesis arising during pregnancy 10/13/2023   Elevated serum creatinine      Plan:  1. Supervision of high risk  pregnancy in second trimester Routine prenatal care Patient needs to be scheduled for anatomy scan Patient declines Pap at this time and would request postpartum Pap. - CBC/D/Plt+RPR+Rh+ABO+RubIgG... - Culture, OB Urine - Hemoglobin A1c - GC/Chlamydia probe amp ([place])not at [facility]  2. Hypokalemia due to excessive gastrointestinal loss of potassium Currently on potassium repletion - Comp Met (CMET)  3. AKI (acute kidney injury) [facility]) Patient had AKI while hospitalized on 10/16 - Comp Met (CMET)  4. Chronic hypertension complicating or reason for care during pregnancy, second trimester Blood pressure well-controlled today on Procardia 30 mg.  Will continue to monitor Recommend ASA daily - Comp Met (CMET) - Protein / creatinine ratio, urine  5. Seizure-like activity [facility]) Patient friend reports she was with her when she had the seizure-like activity.  Twice in the last week.  Reports patient was staring off into space squeezing her hand and then had tonic-clonic seizure with loss of bladder control. - Ambulatory referral to Neurology  6. [redacted] weeks gestation of pregnancy - CBC/D/Plt+RPR+Rh+ABO+RubIgG... - Culture, OB Urine - Hemoglobin A1c - GC/Chlamydia probe amp ([place])not at [facility]  7.  History of cesarean delivery For C-section due to fetal intolerance of labor.  Patient had second delivery vaginally and her third was a C-section and patient reports she is unsure why.  These occurred at [facility].  Patient reports her VBAC was a forceps delivery which is likely the reason C-section was recommended for her third delivery.  Patient requesting VBAC.  8.  History of forceps delivery   Initial labs drawn. Continue prenatal vitamins. Genetic Screening discussed,  patient declined all genetic screening . Ultrasound discussed; fetal anatomic survey: ordered. Problem list reviewed and updated. The nature of [facility] with multiple  MDs and other Advanced Practice Providers was explained to patient; also emphasized that residents, students are part of our team. Routine obstetric precautions reviewed. Return in about 2 weeks (around 11/11/2023) for ROB.

## 2023-10-29 DIAGNOSIS — O0992 Supervision of high risk pregnancy, unspecified, second trimester: Secondary | ICD-10-CM | POA: Insufficient documentation

## 2023-10-29 LAB — CBC/D/PLT+RPR+RH+ABO+RUBIGG...
Antibody Screen: NEGATIVE
Basophils Absolute: 0 10*3/uL (ref 0.0–0.2)
Basos: 0 %
EOS (ABSOLUTE): 0.1 10*3/uL (ref 0.0–0.4)
Eos: 1 %
HCV Ab: NONREACTIVE
HIV Screen 4th Generation wRfx: NONREACTIVE
Hematocrit: 28.2 % — ABNORMAL LOW (ref 34.0–46.6)
Hemoglobin: 9.8 g/dL — ABNORMAL LOW (ref 11.1–15.9)
Hepatitis B Surface Ag: NEGATIVE
Immature Grans (Abs): 0 10*3/uL (ref 0.0–0.1)
Immature Granulocytes: 0 %
Lymphocytes Absolute: 1.7 10*3/uL (ref 0.7–3.1)
Lymphs: 29 %
MCH: 27.9 pg (ref 26.6–33.0)
MCHC: 34.8 g/dL (ref 31.5–35.7)
MCV: 80 fL (ref 79–97)
Monocytes Absolute: 0.3 10*3/uL (ref 0.1–0.9)
Monocytes: 6 %
Neutrophils Absolute: 3.6 10*3/uL (ref 1.4–7.0)
Neutrophils: 64 %
Platelets: 355 10*3/uL (ref 150–450)
RBC: 3.51 x10E6/uL — ABNORMAL LOW (ref 3.77–5.28)
RDW: 14.5 % (ref 11.7–15.4)
RPR Ser Ql: NONREACTIVE
Rh Factor: POSITIVE
Rubella Antibodies, IGG: 1.51 {index} (ref >0.99)
WBC: 5.7 10*3/uL (ref 3.4–10.8)

## 2023-10-29 LAB — COMPREHENSIVE METABOLIC PANEL
ALT: 12 [IU]/L (ref 0–32)
AST: 11 [IU]/L (ref 0–40)
Albumin: 3.9 g/dL — ABNORMAL LOW (ref 4.0–5.0)
Alkaline Phosphatase: 53 [IU]/L (ref 44–121)
BUN/Creatinine Ratio: 11 (ref 9–23)
BUN: 7 mg/dL (ref 6–20)
Bilirubin Total: 0.3 mg/dL (ref 0.0–1.2)
CO2: 20 mmol/L (ref 20–29)
Calcium: 9 mg/dL (ref 8.7–10.2)
Chloride: 100 mmol/L (ref 96–106)
Creatinine, Ser: 0.61 mg/dL (ref 0.57–1.00)
Globulin, Total: 2.9 g/dL (ref 1.5–4.5)
Glucose: 94 mg/dL (ref 70–99)
Potassium: 3.3 mmol/L — ABNORMAL LOW (ref 3.5–5.2)
Sodium: 137 mmol/L (ref 134–144)
Total Protein: 6.8 g/dL (ref 6.0–8.5)
eGFR: 126 mL/min/{1.73_m2} (ref >59)

## 2023-10-29 LAB — HCV INTERPRETATION

## 2023-10-29 LAB — PROTEIN / CREATININE RATIO, URINE
Creatinine, Urine: 376 mg/dL
Protein, Ur: 65.5 mg/dL
Protein/Creat Ratio: 174 mg/g{creat} (ref 0–200)

## 2023-10-29 LAB — HEMOGLOBIN A1C
Est. average glucose Bld gHb Est-mCnc: 114 mg/dL
Hgb A1c MFr Bld: 5.6 % (ref 4.8–5.6)

## 2023-10-29 NOTE — Assessment & Plan Note (Signed)
  NURSING  PROVIDER  Conservator, museum/gallery for Women Dating by LMP  [facility] Model Traditional Anatomy U/S   Initiated care at  [employer]  English              LAB RESULTS   Support Person  Genetics NIPS:  AFP:     NT/IT (FT only)     Carrier Screen [place]:   Rhogam  O/Positive/-- (10/30 1115) A1C/GTT Early:  Third trimester:   Flu Vaccine     TDaP Vaccine   Blood Type O/Positive/-- (10/30 1115)  Covid Vaccine  Antibody Negative (10/30 1115)  RSV Vaccine  Rubella 1.51 (10/30 1115)  Feeding Plan breast RPR Non Reactive (10/30 1115)  Contraception ?? HBsAg Negative (10/30 1115)  Circumcision  HIV Non Reactive (10/30 1115)  Pediatrician   HCVAb Non Reactive (10/30 1115)  Prenatal Classes       Pap No results found for: "DIAGPAP"  BTL Consent  GC/CT Initial:   36wks:    VBAC Consent  GBS   For PCN allergy, check sensitivities        DME Rx [ ]  BP cuff [ ]  Weight Scale Waterbirth  [ ]  Class [ ]  Consent [ ]  CNM visit  PHQ9 & GAD7 [  ] new OB [  ] 28 weeks  [  ] 36 weeks Induction  [ ]  Orders Entered [ ] Foley Y/N

## 2023-10-30 LAB — CULTURE, OB URINE

## 2023-10-30 LAB — URINE CULTURE, OB REFLEX

## 2023-10-30 MED ORDER — ASPIRIN 81 MG PO TBEC
81.0000 mg | DELAYED_RELEASE_TABLET | Freq: Every day | ORAL | 12 refills | Status: DC
Start: 2023-10-30 — End: 2023-12-16

## 2023-11-05 ENCOUNTER — Telehealth: Payer: Self-pay | Admitting: Lactation Services

## 2023-11-05 DIAGNOSIS — R569 Unspecified convulsions: Secondary | ICD-10-CM

## 2023-11-05 DIAGNOSIS — O10912 Unspecified pre-existing hypertension complicating pregnancy, second trimester: Secondary | ICD-10-CM

## 2023-11-05 DIAGNOSIS — O0992 Supervision of high risk pregnancy, unspecified, second trimester: Secondary | ICD-10-CM

## 2023-11-05 NOTE — Telephone Encounter (Signed)
Called MFM to schedule anatomy scan. Appointment scheduled for first available on Monday December 9 at 9:15.   Called patient to inform her of appointment date and time. Received a message that call cannot be completed as dialed x 2.   Called alternate number listed for [place]. LM for him to have her call the office at her earliest convenience to inform her of [place] appointment.   Patient does not have Mychart.

## 2023-11-06 NOTE — Telephone Encounter (Signed)
Attempted to call patient to inform of [place] appointment. Received message that call could not be completed at this time.   Attempted to call friend, Neurosurgeon. He did not answer. LM for patient to call the office at [phone] in regards to an appointment.

## 2023-11-11 ENCOUNTER — Encounter: Payer: Medicaid Other | Admitting: Obstetrics and Gynecology

## 2023-11-25 DIAGNOSIS — Z98891 History of uterine scar from previous surgery: Secondary | ICD-10-CM | POA: Insufficient documentation

## 2023-11-25 DIAGNOSIS — F1290 Cannabis use, unspecified, uncomplicated: Secondary | ICD-10-CM | POA: Insufficient documentation

## 2023-11-25 DIAGNOSIS — O10919 Unspecified pre-existing hypertension complicating pregnancy, unspecified trimester: Secondary | ICD-10-CM | POA: Insufficient documentation

## 2023-11-25 DIAGNOSIS — R569 Unspecified convulsions: Secondary | ICD-10-CM | POA: Insufficient documentation

## 2023-11-25 DIAGNOSIS — O99210 Obesity complicating pregnancy, unspecified trimester: Secondary | ICD-10-CM | POA: Insufficient documentation

## 2023-11-25 DIAGNOSIS — O34219 Maternal care for unspecified type scar from previous cesarean delivery: Secondary | ICD-10-CM | POA: Insufficient documentation

## 2023-12-04 ENCOUNTER — Encounter: Payer: Medicaid Other | Admitting: Obstetrics & Gynecology

## 2023-12-07 ENCOUNTER — Ambulatory Visit: Payer: Medicaid Other

## 2023-12-07 ENCOUNTER — Encounter: Payer: Medicaid Other | Admitting: Obstetrics & Gynecology

## 2023-12-07 DIAGNOSIS — F1290 Cannabis use, unspecified, uncomplicated: Secondary | ICD-10-CM

## 2023-12-07 DIAGNOSIS — Z98891 History of uterine scar from previous surgery: Secondary | ICD-10-CM

## 2023-12-07 DIAGNOSIS — O10919 Unspecified pre-existing hypertension complicating pregnancy, unspecified trimester: Secondary | ICD-10-CM

## 2023-12-07 DIAGNOSIS — R569 Unspecified convulsions: Secondary | ICD-10-CM

## 2023-12-07 DIAGNOSIS — O34219 Maternal care for unspecified type scar from previous cesarean delivery: Secondary | ICD-10-CM

## 2023-12-07 DIAGNOSIS — O99210 Obesity complicating pregnancy, unspecified trimester: Secondary | ICD-10-CM

## 2023-12-16 ENCOUNTER — Other Ambulatory Visit (HOSPITAL_COMMUNITY)
Admission: RE | Admit: 2023-12-16 | Discharge: 2023-12-16 | Disposition: A | Discharge status: Home / Self Care | Payer: Medicaid Other | Source: Ambulatory Visit | Attending: Obstetrics & Gynecology | Admitting: Obstetrics & Gynecology

## 2023-12-16 ENCOUNTER — Encounter: Payer: Self-pay | Admitting: Obstetrics & Gynecology

## 2023-12-16 ENCOUNTER — Ambulatory Visit (INDEPENDENT_AMBULATORY_CARE_PROVIDER_SITE_OTHER): Payer: Medicaid Other | Admitting: Obstetrics & Gynecology

## 2023-12-16 VITALS — BP 144/87 | HR 90 | Wt 163.0 lb

## 2023-12-16 DIAGNOSIS — O09299 Supervision of pregnancy with other poor reproductive or obstetric history, unspecified trimester: Secondary | ICD-10-CM | POA: Insufficient documentation

## 2023-12-16 DIAGNOSIS — O10919 Unspecified pre-existing hypertension complicating pregnancy, unspecified trimester: Secondary | ICD-10-CM

## 2023-12-16 DIAGNOSIS — O0992 Supervision of high risk pregnancy, unspecified, second trimester: Secondary | ICD-10-CM | POA: Diagnosis present

## 2023-12-16 DIAGNOSIS — Z3A26 26 weeks gestation of pregnancy: Secondary | ICD-10-CM | POA: Diagnosis present

## 2023-12-16 DIAGNOSIS — F649 Gender identity disorder, unspecified: Secondary | ICD-10-CM

## 2023-12-16 DIAGNOSIS — O34219 Maternal care for unspecified type scar from previous cesarean delivery: Secondary | ICD-10-CM

## 2023-12-16 DIAGNOSIS — O09292 Supervision of pregnancy with other poor reproductive or obstetric history, second trimester: Secondary | ICD-10-CM

## 2023-12-16 DIAGNOSIS — O10012 Pre-existing essential hypertension complicating pregnancy, second trimester: Secondary | ICD-10-CM

## 2023-12-16 DIAGNOSIS — O99013 Anemia complicating pregnancy, third trimester: Secondary | ICD-10-CM

## 2023-12-16 DIAGNOSIS — Z98891 History of uterine scar from previous surgery: Secondary | ICD-10-CM

## 2023-12-16 DIAGNOSIS — E669 Obesity, unspecified: Secondary | ICD-10-CM

## 2023-12-16 MED ORDER — NIFEDIPINE ER 60 MG PO TB24: 60.0000 mg | ORAL_TABLET | Freq: Every day | ORAL | 2 refills | Status: DC

## 2023-12-16 MED ORDER — BLOOD PRESSURE KIT DEVI: 1.0000 | Freq: Once | 0 refills | Status: AC

## 2023-12-16 MED ORDER — ASPIRIN 81 MG PO TBEC
162.0000 mg | DELAYED_RELEASE_TABLET | Freq: Every day | ORAL | 12 refills | Status: DC
Start: 2023-12-16 — End: 2024-03-07

## 2023-12-16 NOTE — Patient Instructions (Addendum)
Return to office for any scheduled appointments. Call the office or go to the MAU at [facility] at [facility] if: You begin to have strong, frequent contractions Your water breaks.  Sometimes it is a big gush of fluid, sometimes it is just a trickle that keeps getting your underwear wet or running down your legs You have vaginal bleeding.  It is normal to have a small amount of spotting if your cervix was checked.  You do not feel your baby moving like normal.  If you do not, get something to eat and drink and lay down and focus on feeling your baby move.   If your baby is still not moving like normal, you should call the office or go to MAU. Any other obstetric concerns.  Oral Glucose Tolerance Test During Pregnancy Why am I having this test? The oral glucose tolerance test (GTT) is done to check how your body processes blood sugar (glucose). This is one of several tests used to diagnose diabetes that develops during pregnancy (gestational diabetes mellitus). Gestational diabetes is a short-term form of diabetes that some women develop while they are pregnant. It usually occurs during the second or third trimester of pregnancy and goes away after delivery. Testing, or screening, for gestational diabetes usually occurs around 82 of pregnancy. This test may also be needed earlier if: You have a history of gestational diabetes. There is a history of giving birth to very large babies or of losing pregnancies (having stillbirths). You have signs and symptoms of diabetes, such as: Changes in your eyesight. Tingling or numbness in your hands or feet. Changes in hunger, thirst, and urination, and these are not explained by your pregnancy. What is being tested? This test measures the amount of glucose in your blood at different times during a period of 2 hours. This shows how well your body can process glucose.  You will have three separate blood draws. What kind of sample is  taken?  Blood samples are required for this test. They are usually collected by inserting a needle into a blood vessel. How do I prepare for this test? For 3 days before your test, eat normally. Have plenty of carbohydrate-rich foods. You will be asked not to eat or drink anything other than water (to fast) starting 8-10 hours before the test. Tell a health care provider about: All medicines you are taking, including vitamins, herbs, eye drops, creams, and over-the-counter medicines. Any blood disorders you have. Any surgeries you have had. Any medical conditions you have. What happens during the test? First, your blood glucose will be measured. This is referred to as your fasting blood glucose because you fasted before the test. Then, you will drink a glucose solution that contains a certain amount of glucose. Your blood glucose will be measured again 1 and 2 hours after you drink the solution. This test takes about 2 hours to complete. You will need to stay at the testing location during this time. During the testing period: Do not eat or drink anything other than the glucose solution. Do not exercise. Do not use any products that contain nicotine or tobacco, such as cigarettes, e-cigarettes, and chewing tobacco. These can affect your test results. If you need help quitting, ask your health care provider. The testing procedure may vary among health care providers and hospitals. How are the results reported? Your results will be reported as milligrams of glucose per deciliter of blood (mg/dL) or millimoles per liter (mmol/L). There  is more than one source for screening and diagnosis reference values used to diagnose gestational diabetes. Your health care provider will compare your results to normal values that were established after testing a large group of people (reference values). Reference values may vary among labs and hospitals. For this test, reference values are: Fasting: 92 mg/dL 1  hour: 937 mg/dL  2 hour: 169 mg/dL   What do the results mean? Results below the reference values are considered normal. If one or more of your blood glucose levels are at or above the reference values, you will be diagnosed with gestational diabetes.  Talk with your health care provider about what your results mean. Questions to ask your health care provider Ask your health care provider, or the department that is doing the test: When will my results be ready? How will I get my results? What are my treatment options? What other tests do I need? What are my next steps? Summary The oral glucose tolerance test (GTT) is one of several tests used to diagnose diabetes that develops during pregnancy (gestational diabetes mellitus). Gestational diabetes is a short-term form of diabetes that some women develop while they are pregnant. You may also have this test if you have any symptoms or risk factors for this type of diabetes. Talk with your health care provider about what your results mean. This information is not intended to replace advice given to you by your health care provider. Make sure you discuss any questions you have with your health care provider.  TDaP Vaccine Pregnancy Get the Whooping Cough Vaccine While You Are Pregnant (CDC)  It is important for women to get the whooping cough vaccine in the third trimester of each pregnancy. Vaccines are the best way to prevent this disease. There are 2 different whooping cough vaccines. Both vaccines combine protection against whooping cough, tetanus and diphtheria, but they are for different age groups: Tdap: for everyone 11 years or older, including pregnant women  DTaP: for children 2 months through 31 years of age  You need the whooping cough vaccine during each of your pregnancies The recommended time to get the shot is during your 27th through 36th week of pregnancy, preferably during the earlier part of this time period. The Centers for  Disease Control and Prevention (CDC) recommends that pregnant women receive the whooping cough vaccine for adolescents and adults (called Tdap vaccine) during the third trimester of each pregnancy. The recommended time to get the shot is during your 27th through 36th week of pregnancy, preferably during the earlier part of this time period. This replaces the original recommendation that pregnant women get the vaccine only if they had not previously received it. The [employer] of Obstetricians and Gynecologists and the [employer] support this recommendation.  You should get the whooping cough vaccine while pregnant to pass protection to your baby frame support disabled and/or not supported in this browser  Learn why Vernona Rieger decided to get the whooping cough vaccine in her 3rd trimester of pregnancy and how her baby girl was born with some protection against the disease. Also available on YouTube. After receiving the whooping cough vaccine, your body will create protective antibodies (proteins produced by the body to fight off diseases) and pass some of them to your baby before birth. These antibodies provide your baby some short-term protection against whooping cough in early life. These antibodies can also protect your baby from some of the more serious complications that come along with  whooping cough. Your protective antibodies are at their highest about 2 weeks after getting the vaccine, but it takes time to pass them to your baby. So the preferred time to get the whooping cough vaccine is early in your third trimester. The amount of whooping cough antibodies in your body decreases over time. That is why CDC recommends you get a whooping cough vaccine during each pregnancy. Doing so allows each of your babies to get the greatest number of protective antibodies from you. This means each of your babies will get the best protection possible against this disease.  Getting the  whooping cough vaccine while pregnant is better than getting the vaccine after you give birth Whooping cough vaccination during pregnancy is ideal so your baby will have short-term protection as soon as he is born. This early protection is important because your baby will not start getting his whooping cough vaccines until he is 2 months old. These first few months of life are when your baby is at greatest risk for catching whooping cough. This is also when he's at greatest risk for having severe, potentially life-threating complications from the infection. To avoid that gap in protection, it is best to get a whooping cough vaccine during pregnancy. You will then pass protection to your baby before he is born. To continue protecting your baby, he should get whooping cough vaccines starting at 2 months old. You may never have gotten the Tdap vaccine before and did not get it during this pregnancy. If so, you should make sure to get the vaccine immediately after you give birth, before leaving the hospital or birthing center. It will take about 2 weeks before your body develops protection (antibodies) in response to the vaccine. Once you have protection from the vaccine, you are less likely to give whooping cough to your newborn while caring for him. But remember, your baby will still be at risk for catching whooping cough from others. A recent study looked to see how effective Tdap was at preventing whooping cough in babies whose mothers got the vaccine while pregnant or in the hospital after giving birth. The study found that getting Tdap between 27 through 36 weeks of pregnancy is 85% more effective at preventing whooping cough in babies younger than 2 months old. Blood tests cannot tell if you need a whooping cough vaccine There are no blood tests that can tell you if you have enough antibodies in your body to protect yourself or your baby against whooping cough. Even if you have been sick with whooping cough  in the past or previously received the vaccine, you still should get the vaccine during each pregnancy. Breastfeeding may pass some protective antibodies onto your baby By breastfeeding, you may pass some antibodies you have made in response to the vaccine to your baby. When you get a whooping cough vaccine during your pregnancy, you will have antibodies in your breast milk that you can share with your baby as soon as your milk comes in. However, your baby will not get protective antibodies immediately if you wait to get the whooping cough vaccine until after delivering your baby. This is because it takes about 2 weeks for your body to create antibodies. Learn more about the health benefits of breastfeeding.  [facility] Providers  Central/Southeast  ([id number]) [facility] Manson Passey, MD; Deirdre Priest, MD; Lum Babe, MD; Leveda Anna, MD; McDiarmid, MD; Jerene Bears, MD [address] [place], [place] [address] [phone] Mon-Fri 8:30-12:30, 1:30-5:00  Providers come  to see babies during newborn hospitalization Only accepting infants of Mother's who are seen at [facility] or have siblings seen at   [facility] - Yes; Tricare - Yes   [facility] [place], MD [address], [place], [place] [address] [phone] Mon, Tue, Thur, Fri 8:30-5:00, Wed 10:00-7:00 (closed 1-2pm daily for lunch) [facility] residents with no insurance.  [place] [employer] only with Medicaid/insurance; Tricare - no  [facility]) - [facility], MD; Manson Passey, MD; Ave Filter, MD; Luna Fuse, MD; Kennedy Bucker, MD; Florestine Avers, MD; Melchor Amour, MD; Yetta Barre,  MD; Konrad Dolores, MD; Kathlene November, MD; Jenne Campus, MD; Wynetta Emery, MD; Duffy Rhody, MD; Gerre Couch, NP [address] [place], [place], [place] [address] [phone] Mon, Tue, Thur, Fri 8:30-5:30, Wed 9:30-5:30, Sat 8:30-12:30 Only accepting infants of  first-time parents or siblings of current patients Hospital discharge coordinator will make follow-up appointment Medicaid - yes; Tricare - yes  [place] [phone]) [facility] Ilean China, MD; Earlene Plater, MD; Jamesetta Orleans, MD; Alvera Novel, MD; Rana Snare, MD; [facility], MD; Shaaron Adler, MD; Hosie Poisson, MD; Mayford Knife, MD [address] [place], [place] [address] [phone] Mon-Fri 8:30-5:00, closed for lunch 12:30-1:30; Sat-Sun 10:00-1:00 Accepting Newborns with commercial insurance only, must call prior to delivery to be accepted into  practice.  Medicaid - no, Tricare - yes   [facility] [address] [place], [place] [address] [phone] or [phone] Mon to Fri 8am to 10pm, Sat 8am to 1pm (virtual only on weekends) Only accepts Medicaid Healthy Blue pts  [facility] - Pediatrics at Elige Radon, MD; Sabino Dick, MD; Quitman Livings, MD; Betha Loa, NP; Claretha Cooper, MD; Lelon Perla, MD [address] [place], [place] [address] [phone] Mon-Fri 8:30-5:30 Medicaid - yes, Tricare - yes  [place] [phone]) [facility] Marcie Mowers, MD [address], [place], [place] [address] [phone] Iona Hansen, Wed Fri 8:30-5:00, Sat 8:30-12:00, Closed Thursdays Accepting siblings of established patients and first time mom's if you call prenatally Medicaid- yes; Tricare - yes  [facility] Lutricia Feil, [place]; Tracie Harrier, MD; Rusty Aus; [place], PA; Wynelle Link, MD; Azucena Cecil, MD;  [address] [place], [place] [address] [phone] Mon-Fri 8:30-5:00, closed for lunch 1-2 Only accepting newborns of established patients Medicaid- no; Tricare - yes  [facility] [phone]) [facility] Morene Crocker, MD; [address], [place], [place] [address] [phone] Mon-Fri 8:00-5:00 Medicaid - No; Tricare - Yes  [facility], [place]; Pole [place],  [place] [address] [place], [place] [address] [phone] Mon-Fri 8:00-5:00 Medicaid - No, Tricare - Yes  [facility] Cardell Peach, MD; Nash Dimmer, MD; [place], [place] [address], [address] [place], [place] [address] [phone]  Mon-Fri 8:00-5:00 Medicaid - No; Tricare - Yes  [facility] [address], [place], [place] [address] [phone] Mon-Fri 8:30-5:00 (lunch 12:00-1:00) Medicaid -Yes; Tricare - Yes  [facility], MD [address] [place], [place], [place] [address] [phone] Mon-Fri 8:00-5:00 Seeing newborns of current patients only. No new patients Medicaid - No, Tricare - yes  Nature conservation officer at [place] [address] MD [address] [place], [place] [address] [phone] Mon-Fri 8:00-5:00 Medicaid -yes as secondary coverage only; Tricare - yes  [facility] [place], [place]; [place], [place]; Avis Epley, MD; Vonna Kotyk, MD; Clance Boll, MD; [place], [place]; [place], NP; Vaughan Basta, MD; [place], MD [address] [place], [place] [address] [phone] Mon-Fri 8:30-5:00, Sat 9:00-11:00 Accepts commercial  insurance ONLY. Offers free prenatal information sessions for families. Medicaid - No, Tricare - Call first  [facility] [place], MD; [place], [place]; [place], [place]; [place], [place] [address] [place] [address] [phone] Mon-Fri 7:30-5:30 Medicaid - Yes; Ailene Rud yes  [place] [phone] & [phone])  [facility], MD [address], [place], [place] [address] [phone] Mon-Thur 8:00-6:00, closed for lunch 12-2, closed Fridays Medicaid - yes; Tricare - no  [facility] Dareen Piano, NP; Cyndia Bent, MD; [employer], [place]; [place], [place] [address], [place], [place] [address] [phone] Mon-Fri 7:30-4:30 Medicaid - yes, Tricare - yes  [facility]  Juanito Doom, MD; Janene Harvey, NP; Vonita Moss, MD; Donn Pierini, NP [address],  [place], [place] [address] [phone] Mon-Fri 8:30-5:00, closed for lunch 1-2, Sat 8:30-12:00 - sick visits only Providers come to see babies at [facility] Only accepting newborns of siblings and first time parents ONLY if who have met with office prior to delivery Medicaid -Yes; Tricare - yes  [facility] - [place], [place]; Spero Geralds, NP; Earlene Plater, MD; Lucretia Roers, MD:  [address], [place], [place] [address] [phone] Mon- Fri 8:00-5:00, Sat 9:00-12:00 - sick visits only Accepting siblings of established patients and first time mom/baby Medicaid - Yes; Tricare - yes Patients must have vaccinations (baby vaccines)  [place] [phone] & [phone])  Adult nurse HealthCare at [facility] [address] [place], [place] [address] [phone] Mon-Fri 8:00-5:00 Medicaid - no; Tricare - yes  [facility] [place], MD; [place], [place]; [place], [place] [address], [place], [place] [address] [phone] Mon-Fri 8:00-5:00 Medicaid- yes; Tricare - yes  [facility] - Ardeen Jourdain, MD; Yetta Barre, NP; [place], [place] [address] [place], [place], [place] [address] [phone] Mon-Fri 8:00-5:00 Medicaid - Yes; Tricare - yes  [address] [place] [phone])  [facility] [place], [place]; [place], [place]; Eddie Candle, MD; Normand Sloop, MD; [place], NP; [place], DO; [place], [place]; Constance Goltz, MD; Ruthann Cancer, MD; Vear Clock, MD; [place], [place]; [place], [place]; [place], [place] [address] [facility] [address], [place], [place] [address] [phone] Mon-Fri 8:30-5:00, Sat 9:00-12:00 - sick only Please register online [email or web address] then schedule online or call office Medicaid-Yes; Tricare -yes  [facility] - [place]  [place], MD; Romualdo Bolk, MD; [place], MD; [place], NP; [place], [place]; Antonietta Barcelona, MD; Mayford Knife, NP; Shelva Majestic, MD [address], [place], [place]  [address] [phone] Mon-Fri 8:00-5:30, Sat&Sun by appointment (phones open at 8:30) Medicaid - Yes; Tricare - yes  [place] [phone] & [phone]) [facility] Mariel Aloe; [place], MD; Roger [place], MD; Arvilla Market, NP; [place], DO [address], [place], [place] [address] [phone] M-F 8:00 - 5:15, Sat/Sun 9-12 sick visits only Medicaid - No; Tricare - yes  [facility]  [place], PA-C; [place], PA-C; [place], DO; [place], PA-C; [place], PA-C; Roselyn Bering, MD [address], [place], [place] [address] [phone] Mon-Thur 8:00-7:00, Fri 8:00-5:00 Accepting Medicaid for 13 and under only   [facility] at [place] [facility] [place]) [place], [place]; [place], FNP; Berneda Rose, MD; Luther Redo, PA-C; Lavonia Drafts, MD; Kellie Simmering, FNP; Genevie Cheshire, FNP; Evaristo Bury, MD; Berneda Rose, MD [phone] [address], [place], [place] [address] [phone] Mon-Fri 8:30-5:30 Medicaid - Yes; Tricare - yes  [facility] - [address]  [place], [place];  Whitney Post, MD; Hennie Duos, MD; Wynne Dust, MD; [place], NP [address], [place], [place] [address] [phone] Mon-Thur 8:00-5:30, Fri 8:00-5:00, Sat 9:00-12:00 Medicaid - yes, Tricare - yes  [place] [phone])  [facility], [place]; Lenise Arena, MD; [place], [place] [address], [place], [place] [address] [phone] Mon-Fri 8:00-5:00, closed for lunch 12-1 Medicaid - No; Tricare - yes  Nature conservation officer at [facility], MD [address] [place], [place] [address] [phone] Mon-Fri 8:00-5:00 Medicaid - No; Tricare - yes  [facility], MD; Tami Ribas, MD; Mariam Dollar, MD; Yetta Barre, MD [address] [place] [address] [facility] [address], [place], [place] [address] [phone] Mon-Fri 8:00-5:00 Medicaid- Yes; Tricare - yes  [place] [phone])  Adult nurse HealthCare at [facility], [place]; [place], MD [address] [place], [place], [place] [address] [phone] Mon-Fri 8:00-5:00 Medicaid - No; Tricare - yes  [facility] - Whitney Post - CPNP [address] [place], [place], [place] [address] [phone] Mon-Weds 8:00-6:00, Thurs-Fri 8:00-5:00, Sat 9:00-12:00 Medicaid - yes; Tricare - yes   [facility] Katharina Caper, MD; [place], [place] [address] [place], [place] [address] [phone] Mon-Fri 8:00-5:00 Medicaid - yes; Tricare - yes  [facility] Pediatric Providers  [facility] [address] [place], [place] [address] [phone] Sheral Flow: 8am -8pm, Tues, Weds: 8am - 5pm; Fri: 8-1 Medicaid - Yes; Tricare - yes  [facility] Rachel Bo, MD; Laural Benes, MD; Anner Crete, MD; [place], [place]; [place], [place] [address] [place], [place] [address] [phone] M-F 8:30 - 5:00 Medicaid - Call office; Tricare -yes  [facility] Edson Snowball, MD; Shanon Rosser, MD, Chelsea Primus, MD; Shirlyn Goltz, PNP; Wardell [place], NP [phone] [address] [place], [place] [address] [phone] M-F 8:30 - 5:00, Sat/Sun 8:30 - 12:30 (sick visits) Medicaid - Call office; Tricare -yes  [facility] Melvyn Neth, MD; Karl Luke, PNP; Princess Bruins, MD; [place], [place]; [place], NP; Cynda Familia [address], [place], [place] [address] [phone] M-F 8:30 - 5:00 Medicaid - Call office; Tricare - yes  [facility] Jesusita Oka, MD; Dierdre Highman, MD; Earnest [place], MD; Timothy Lasso, MD; [place], MD [phone] [address], [place], [place] [address] [phone] M-Thur: 8:00 - 5:00; Fri: 8:00 - 4:00 Medicaid - yes; Tricare - yes  [facility] [address] [place], [place] [address] [phone] M-F: 8:30- 5:00, closed for lunch 12:30 - 1:00 Medicaid - yes; Tricare -yes  [facility] [address], [place], [place] [address] [phone] M-F 8:00 - 5:00 Medicaid - yes; Tricare - yes  [facility] Gorman,  DO; [place], DO; [place], NP [address], [place], [place] [address] [phone] M-F 8:00 - 5:00, Closed 12-1 for lunch Medicaid - Call; Tricare - yes  [facility] Meredith Mody, MD [address] [place], [place] [address] [phone] M-F: 8:00-5:00, Sat: 8:00 - noon Medicaid - call; Tricare -yes  [facility] Pediatric Providers  [facility] [place], [place] [address] [place], [place], [place] [address] [phone] M-W: 8:00-5:00, Thur: 8:00 - 7:00, Fri: 8:00 - noon Medicaid - yes; Tricare - yes  [facility] - Quay Burow, FNP [address] [place], [place] [address] [phone] M-F 8:00 - 5:00, Closed for lunch 12-1 Medicaid - yes; Tricare - yes  [facility] Pediatric Providers  [facility] Primary Care at [place], [place], Alinda Money, MD, [place], FNP-C [address],  [address], [address], [place], [place] [address] [phone] M-T 8:00-5:00, Wed-Fri 7:00-6:00 Medicaid - Yes; Tricare -yes  [facility], DO; [address] [place], [place] [address] [phone] M-F 8:00 - 5:00, closed for lunch 12-1 Medicaid - Yes; Tricare - yes  [facility] and Internal Medicine  Zachery Dauer, MD; Gladstone Lighter, MD; Collie Siad, MD; Freda Jackson, MD; Rich Number, MD; Darryl Nestle, MD; Melinda Crutch, MD, Audria Nine, MD; Tawanna Cooler, MD; Steffanie Dunn, MD; Byrd Hesselbach, MD; Lucretia Roers, MD [address], [place], [place] [address] [phone] M-F 8:00-5:00 Medicaid - yes; Tricare - yes  [facility], MD (speaks [place] and Hindi) [address] [place], [place] [address] [phone] M-F: 8:30 - 5:00, closed 12:30 - 1 for lunch Medicaid - Yes; Tricare -yes  [facility] Pediatric Providers  [facility] Shanda Bumps, MD; Chanetta Marshall, MD; Laurell Josephs, MD [address] [place], [place] [address] [phone] M-Th: 8:00 - 5:30, Fri: 8:00 - 12:00 Medicaid - yes; Tricare -  yes  [facility] at [facility], NP; Thora Lance, MD; Orrin Brigham, MD [phone] [address] [place], [place] [address] [phone] M-F: 8:00 - 5:00 Medicaid - yes; Tricare - yes  [facility] [place], NP; Orson Slick, NP; Salley Scarlet, NP; Linton Flemings, MD; Mayford Knife, MD, [place], NP, Emelda Fear, MD; Nida Boatman [address], [place], [place] [address] [phone] M-F: 8:30 - 5:30p Medicaid - yes; Tricare - yes Other locations available as well  [facility], MD; Andrey Campanile, MD; Neville Route, PA-C [address] [place], [place] [address] [phone] M-W: 8:00am - 7:00pm, Thurs: 8:00am - 8:00pm; Fri: 8:00am - 5:00pm, closed daily from 12-1 for lunch Medicaid - yes; Tricare - yes  [facility] Pediatric Providers  [facility] at Levin Erp, MD; Aggie Cosier, FNP; Bland Span, MD; Tristan Schroeder, MD; [place], PNP; Alesia Banda; [place], [place]; Julian Reil, MD;  [address] [place], [place] [address] [phone] Judie Petit - Caleen Essex: 8am - 5pm, Sat 9-noon Medicaid - Yes; Tricare -yes  [facility] Jaclynn Guarneri, MD; Yetta Barre, FNP; Lilian Kapur, MD; Mariam Dollar, MD [address] Rosezetta Schlatter, [id number] [phone] M-F 8:00 - 5:00 Medicaid - call; Tricare - yes  [facility] Cruz Condon, MD; [place], [place]; Delora Fuel, MD; Dareen Piano, MD; Trudee Grip, MD; Kizzie Ide, MD; Zebedee Iba; Birdena Crandall, MD; Hinton Dyer, MD; [place], MD [address], [place], [place] [address] [phone] M-F 8:00am - 5:00pm; Sat. 9:00 - 11:00 Medicaid - yes; Tricare - yes  [facility], MD [address], [place], [place] [address] [phone] M-F 8:00 - 5:00 Medicaid - [place] Medicaid only; Tricare - yes  [facility] - Illene Bolus, MD; Earlene Plater, [place]; Kenyon Ana, MD [address], [place], [place] [address] [phone] M-F  8:00 - 5:00 Medicaid - yes; Tricare - yes  [facility] [address] [facility] - Lind Covert, MD; Manson Passey, MD, [facility], MD, [place], MD; [place], MD; Katrinka Blazing, MD; [address] [place], [place] [address] [phone] M-F: 8-5 Medicaid - yes; Tricare - yes  [facility] - Henrietta Hoover, [place]; [place], MD; [address] [place], [place] [address] [phone] M-F 8-5 Medicaid - yes; Tricare - yes  [address] [place] Darrol Poke, MD; Tami Ribas, MD; [place], MD; [place], DNP; [place], PNP [address], [address], [place], [place] [address] [phone] M-F 8-5 Medicaid - yes; Tricare - yes  [facility] Delories Heinz, MD; [place], PA-C; Davis-PA-C; [place],  MD [address], [place], [place] [address] [phone] M-F 7am - 5 pm Medicaid - call; Tricare - yes  [facility] [place], PNP; Fredia Beets, MD; Roxan Hockey, MD [address] [place], [place] [address] [phone] M-F 8-5 Medicaid - yes; Tricare - yes  [facility] Kae Heller, MD; Sheliah Hatch, MD; Mayford Knife, FNP; Shon Baton, FNP; Tyron Russell, FNP; Ishmael Holter; [facility] - FNP [address] [place], [place] [address] [phone] M-F 8-5 Medicaid- yes; Tricare - yes  [facility] - Betsy Coder, [place] and [place] Guest, MD; Terrial Rhodes, MD; Hulda Humphrey, MD; Roseanne Reno, MD; [place], [place]; Ala Dach, MD; Fredia Beets, MD; Dimple Casey, MD [address] [place], [place] [address] [phone] M-F: 8-5, Sat: 9-4, Sun 9-12 Medicaid - yes; Tricare - yes  [facility] [place], PNP; Earlene Plater, PNP [address] [place], [place] [address] [phone] M-F 8 - 5, closed 12-1 for lunch Medicaid - yes; Tricare - yes  [facility] Kathyrn Lass, MD; Hal Neer, MD; Dimple Casey, MD; [place], DO [address], [place], [place]  [address] [phone] M-F 8- 5:30 Medicaid - yes; Tricare - yes  [facility] - Biagio Quint, MD; Rosalia Hammers, MD; Gwenith Daily, MD [address] [place], [place] [address] [phone] Judie Petit: Nicholas Lose; Tues-Fri: 8-5; Sat: 9-12 Medicaid - yes; Tricare - yes  [facility] - Bobbye Morton, MD; Daphane Shepherd, MD; Chestine Spore, MD; Haskell Riling, MD; Kate Sable, MD [address], [place], [place] [address] [phone] Judie PetitMarland Kitchen Nicholas LoseFrancee Nodal: 8-5; Sat: 8:30-12:30 Medicaid - yes; Tricare - yes  [facility] - Beckey Rutter, MD; [place], [place] [address], [place], [place] [address] [phone] Mon-Fri: 8-5 Medicaid - yes; Tricare - yes  [facility] - [place], CPNP; [place], [place]; Dimple Casey, MD; Alisa Graff, MD; Cephus Shelling, MD; [address], [place], [place] [address] [phone] M-F: 8-5, closed 1-2 for lunch Medicaid - yes; Tricare - yes  [facility] - [facility] [place], [place]; [place], [place]; Katrinka Blazing, MD; [place], CPNP; [place], [place]; [place], MD; Earlene Plater, MD [address], [place], [place] [address] [phone] M-Thurs: Nicholas Lose; Fri: 8-6; Sat: 9-12; Sun 2-4 Medicaid - yes; Tricare - yes  [facility] Georgeanna Lea, MD; Evette Cristal, MD; Shea Stakes, FNP; Earney Mallet, DO; [address], [place], [place] [address] [phone] M-F: 8-5 Medicaid - yes; Tricare - yes  [facility] Pediatric Providers  [facility] Medicine -Collene Mares, MD; [place], NP [address], [place], [place] [address] [phone] M - Fri: 8am - 5pm, closed for lunch 12-1 Medicaid - Yes; Tricare - yes  [facility] Elinor Parkinson, MD; [name], MD; [place], DO; [place], MD;Hall, PA;  [place], [place]; Orvan Falconer, NP [phone] [address], [place], [place] [address] [phone] M-F 8:00 - 5:00, Sat 8:00 - 11:30 Medicaid - yes; Tricare - yes  [facility] Welton Flakes, MD; [place], MD, [address], MD, [place], MD, [facility], MD; [place], NP; [place], [place];  [address], [place], [place] [address] [phone] M-F 8:10am - 5:00pm Medicaid - yes; Tricare - yes  [facility] Eustaquio Boyden, MD; [place], NP [address] [place], [place] [address] [phone] M-F 8:00 - 5:00 Medicaid - [place] Medicaid only; Tricare - yes  [facility] - Deep Manon Hilding, MD;  Caryn Section, NP [address] [place], [place], [place] [address] [phone] M-F 8:00 - 5:00; Closed for lunch 12 - 1:00 Medicaid - yes; Tricare - yes  [facility] Belva Crome, MD; Jonita Albee, FNP [address] [place], [place] [address] [phone] Mon 9-5; Tues/Wed 10-5; Thurs 8:30-5; Fri: 8-12:30 Medicaid - yes; Tricare - yes  [facility] Pediatric Providers  [facility]  [place], MD; [place], [place] [address] [place], [place] [address] [phone] phone [phone] fax M-F 7:15 - 4:30 Medicaid - yes; Tricare - yes  [facility] Karilyn Cota, MD; [place], DO [address] [place], [place] [address] [phone] M-Fri: 8:30 - 5:00, closed for lunch everyday noon - 1pm Medicaid - Yes; Tricare - yes  [facility] [place], MD; Reuel Boom, MD; Dimas Aguas, MD; Neita Carp, MD; [place], [place]; Bonnita Nasuti, [place]; [place], [place]; [place], [place]; [place], [place] [address] [place], [place] [address] [phone] M-Thurs: 7:30am - 7:00pm; Friday 7:30am - 4pm; Sat: 8:00 - 1:00 Medicaid - Yes; Tricare - yes  [facility] Morton, MD; Conni Elliot, MD; Carroll Kinds, MD; [place], DO [address], [place], [place] [address] [phone] M-Thur: 8:00 - 5:00, Fri: 8:00 - Noon Medicaid - yes; Tricare - yes No [place]  [facility] [place], MD; Nadine Counts, DO; [place], NP; Daphine Deutscher, NP; Lequita Halt, NP; Ellamae Sia, NP; Reginia Forts, NP; Darlyn Read, MD; [place], [place] [address], [place], [place] [address] [phone] M-F 8:00 - 5:00 Medicaid - yes; Tricare - yes  [facility], FNP-C; [place], FNP-C [address] [place], [place] [address] [phone] M, W, R 8:00-5:00, Tues: 8:00am - 7:00pm; Fri 8:00 - noon Medicaid - Yes; Tricare - yes  [facility], MD [address] [place], [place] [address] [phone]  M-Thurs 8:30-5:30, Fri: 8:30-12:30pm Medicaid - Yes; Tricare - N

## 2023-12-16 NOTE — Progress Notes (Signed)
PRENATAL VISIT NOTE  Subjective:  Tamara Krause is a 27 y.o. [id number] at [redacted]w[redacted]d being seen today for ongoing prenatal care.  She is currently monitored for the following issues for this high-risk pregnancy and has Hyperemesis arising during pregnancy; Supervision of high risk pregnancy in second trimester; Seizure-like activity (HCC); Pregnancy with history of cesarean section, antepartum; History of VBAC; Chronic hypertension during pregnancy, antepartum; Marijuana use during pregnancy; Obesity affecting pregnancy, antepartum; and History of severe preeclampsia in last pregnancy on their problem list.  Patient reports no complaints.   Denies any headaches, visual symptoms, RUQ/epigastric pain or other concerning symptoms.  . Vag. Bleeding: None.  Movement: Present. Denies leaking of fluid.  No visit since 10/28/23.  The following portions of the patient's history were reviewed and updated as appropriate: allergies, current medications, past family history, past medical history, past social history, past surgical history and problem list.   Objective:   Vitals:   12/16/23 1359 12/16/23 1420  BP: (!) 151/81 (!) 144/87  Pulse: 88 90  Weight: 163 lb (73.9 kg)     Fetal Status: Fetal Heart Rate (bpm): 160   Movement: Present     General:  Alert, oriented and cooperative. Patient is in no acute distress.  Skin: Skin is warm and dry. No rash noted.   Cardiovascular: Normal heart rate noted  Respiratory: Normal respiratory effort, no problems with respiration noted  Abdomen: Soft, gravid, appropriate for gestational age.  Pain/Pressure: Absent     Pelvic: Cervical exam deferred        Extremities: Normal range of motion.  Edema: None  Mental Status: Normal mood and affect. Normal behavior. Normal judgment and thought content.   Assessment and Plan:  Pregnancy: [id number] at [redacted]w[redacted]d 1. Chronic hypertension during pregnancy, antepartum 2. History of severe preeclampsia in last pregnancy No  symptoms, will check labs today.   Procardia increased to 60 mg daily, ASA also increased to 162 mg daily. Anatomy scan rescheduled, will need serial growth scans ans antenatal testing as per MFM protocol.   - Comprehensive metabolic panel - CBC - Protein / creatinine ratio, urine - NIFEdipine (ADALAT CC) 60 MG 24 hr tablet; Take 1 tablet (60 mg total) by mouth daily.  Dispense: 30 tablet; Refill: 2 - aspirin EC 81 MG tablet; Take 2 tablets (162 mg total) by mouth daily. Swallow whole.  Dispense: 100 tablet; Refill: 12  3. History of VBAC Counseled regarding TOLAC vs RCS; risks/benefits discussed in detail. All questions answered. History of prior VBAC.  Patient elects for TOLAC, consent signed 12/16/2023.  4. [redacted] weeks gestation of pregnancy 5. Supervision of high risk pregnancy in second trimester (Primary) Missed anatomy scan, this was rescheduled for patient.  - GC/Chlamydia probe amp ([place])not at [facility] - Blood Pressure Monitoring (BLOOD PRESSURE KIT) DEVI; 1 Device by Does not apply route once for 1 dose.  Dispense: 1 each; Refill: 0 - Glucose Tolerance, 2 Hours w/1 Hour; Future - CBC; Future - RPR; Future - HIV Antibody (routine testing w rflx); Future Thid trimester labs next visit, patient informed of fasting nature of this test. Preterm labor symptoms and general obstetric precautions including but not limited to vaginal bleeding, contractions, leaking of fluid and fetal movement were reviewed in detail with the patient. Please refer to After Visit Summary for other counseling recommendations.   Return in about 2 weeks (around 12/30/2023) for 2 hr GTT, 3rd trimester labs, TDap, OFFICE OB VISIT (MD only).  Future Appointments  Date Time  Provider Department Center  01/05/2024  8:20 AM [facility] [place]  01/05/2024  8:55 AM [place] Bing, MD [facility] [place]  01/13/2024  7:15 AM [facility] NURSE [facility]  01/13/2024  7:30 AM [facility] US2 [facility]    Jaynie Collins,  MD

## 2023-12-17 LAB — COMPREHENSIVE METABOLIC PANEL
ALT: 6 [IU]/L (ref 0–32)
AST: 11 [IU]/L (ref 0–40)
Albumin: 3.5 g/dL — ABNORMAL LOW (ref 4.0–5.0)
Alkaline Phosphatase: 55 [IU]/L (ref 44–121)
BUN/Creatinine Ratio: 9 (ref 9–23)
BUN: 4 mg/dL — ABNORMAL LOW (ref 6–20)
Bilirubin Total: 0.2 mg/dL (ref 0.0–1.2)
CO2: 21 mmol/L (ref 20–29)
Calcium: 8.7 mg/dL (ref 8.7–10.2)
Chloride: 104 mmol/L (ref 96–106)
Creatinine, Ser: 0.46 mg/dL — ABNORMAL LOW (ref 0.57–1.00)
Globulin, Total: 2.6 g/dL (ref 1.5–4.5)
Glucose: 78 mg/dL (ref 70–99)
Potassium: 4.2 mmol/L (ref 3.5–5.2)
Sodium: 138 mmol/L (ref 134–144)
Total Protein: 6.1 g/dL (ref 6.0–8.5)
eGFR: 134 mL/min/{1.73_m2} (ref >59)

## 2023-12-17 LAB — PROTEIN / CREATININE RATIO, URINE
Creatinine, Urine: 89.6 mg/dL
Protein, Ur: 16.5 mg/dL
Protein/Creat Ratio: 184 mg/g{creat} (ref 0–200)

## 2023-12-17 LAB — CBC
Hematocrit: 27.5 % — ABNORMAL LOW (ref 34.0–46.6)
Hemoglobin: 9.5 g/dL — ABNORMAL LOW (ref 11.1–15.9)
MCH: 28.6 pg (ref 26.6–33.0)
MCHC: 34.5 g/dL (ref 31.5–35.7)
MCV: 83 fL (ref 79–97)
Platelets: 286 10*3/uL (ref 150–450)
RBC: 3.32 x10E6/uL — ABNORMAL LOW (ref 3.77–5.28)
RDW: 14.2 % (ref 11.7–15.4)
WBC: 6.2 10*3/uL (ref 3.4–10.8)

## 2023-12-17 LAB — GC/CHLAMYDIA PROBE AMP (~~LOC~~) NOT AT ARMC
Chlamydia: NEGATIVE
Comment: NEGATIVE
Comment: NORMAL
Neisseria Gonorrhea: NEGATIVE

## 2023-12-18 MED ORDER — FERRIC MALTOL 30 MG PO CAPS: 1.0000 | ORAL_CAPSULE | Freq: Two times a day (BID) | ORAL | 2 refills | Status: AC

## 2023-12-18 NOTE — Addendum Note (Signed)
Addended by: Jaynie Collins A on: 12/18/2023 11:21 AM   Modules accepted: Orders

## 2023-12-25 ENCOUNTER — Telehealth: Payer: Self-pay | Admitting: Lactation Services

## 2023-12-25 NOTE — Telephone Encounter (Signed)
-----   Message from Jaynie Collins sent at 12/18/2023 11:21 AM EST ----- Accrufer prescribed for iron deficiency anemia.

## 2023-12-25 NOTE — Telephone Encounter (Signed)
Called patient to let her now Accufer sent to Pharmacy for anemia. Patient did not answer. No voicemail picked up. Will send Mychart message.

## 2024-01-05 ENCOUNTER — Encounter: Payer: Medicaid Other | Admitting: Obstetrics and Gynecology

## 2024-01-05 ENCOUNTER — Encounter: Payer: Self-pay | Admitting: Obstetrics and Gynecology

## 2024-01-05 ENCOUNTER — Other Ambulatory Visit: Payer: Medicaid Other

## 2024-01-05 DIAGNOSIS — O0933 Supervision of pregnancy with insufficient antenatal care, third trimester: Secondary | ICD-10-CM | POA: Insufficient documentation

## 2024-01-05 DIAGNOSIS — D509 Iron deficiency anemia, unspecified: Secondary | ICD-10-CM | POA: Insufficient documentation

## 2024-01-05 DIAGNOSIS — O99019 Anemia complicating pregnancy, unspecified trimester: Secondary | ICD-10-CM | POA: Insufficient documentation

## 2024-01-05 DIAGNOSIS — D582 Other hemoglobinopathies: Secondary | ICD-10-CM | POA: Insufficient documentation

## 2024-01-07 NOTE — Progress Notes (Signed)
 Patient did not keep her OB appointment for 01/05/2024.  Cornelia Copa MD Attending Center for [employer] Midwife)

## 2024-01-13 ENCOUNTER — Ambulatory Visit: Payer: Medicaid Other | Attending: Family Medicine

## 2024-01-13 ENCOUNTER — Ambulatory Visit: Payer: Medicaid Other

## 2024-02-24 ENCOUNTER — Other Ambulatory Visit: Payer: Self-pay

## 2024-02-24 ENCOUNTER — Ambulatory Visit (INDEPENDENT_AMBULATORY_CARE_PROVIDER_SITE_OTHER): Payer: Medicaid Other | Admitting: Obstetrics and Gynecology

## 2024-02-24 ENCOUNTER — Encounter: Payer: Self-pay | Admitting: Obstetrics and Gynecology

## 2024-02-24 ENCOUNTER — Other Ambulatory Visit (HOSPITAL_COMMUNITY)
Admission: RE | Admit: 2024-02-24 | Discharge: 2024-02-24 | Disposition: A | Discharge status: Home / Self Care | Payer: Medicaid Other | Source: Ambulatory Visit | Attending: Obstetrics and Gynecology | Admitting: Obstetrics and Gynecology

## 2024-02-24 VITALS — BP 137/84 | HR 110 | Wt 173.0 lb

## 2024-02-24 DIAGNOSIS — O99013 Anemia complicating pregnancy, third trimester: Secondary | ICD-10-CM | POA: Diagnosis not present

## 2024-02-24 DIAGNOSIS — Z3A36 36 weeks gestation of pregnancy: Secondary | ICD-10-CM | POA: Diagnosis not present

## 2024-02-24 DIAGNOSIS — O26843 Uterine size-date discrepancy, third trimester: Secondary | ICD-10-CM

## 2024-02-24 DIAGNOSIS — O10913 Unspecified pre-existing hypertension complicating pregnancy, third trimester: Secondary | ICD-10-CM

## 2024-02-24 DIAGNOSIS — O0930 Supervision of pregnancy with insufficient antenatal care, unspecified trimester: Secondary | ICD-10-CM | POA: Insufficient documentation

## 2024-02-24 DIAGNOSIS — D582 Other hemoglobinopathies: Secondary | ICD-10-CM | POA: Diagnosis not present

## 2024-02-24 DIAGNOSIS — Z113 Encounter for screening for infections with a predominantly sexual mode of transmission: Secondary | ICD-10-CM | POA: Diagnosis not present

## 2024-02-24 DIAGNOSIS — Z98891 History of uterine scar from previous surgery: Secondary | ICD-10-CM

## 2024-02-24 DIAGNOSIS — O0993 Supervision of high risk pregnancy, unspecified, third trimester: Secondary | ICD-10-CM

## 2024-02-24 DIAGNOSIS — O0992 Supervision of high risk pregnancy, unspecified, second trimester: Secondary | ICD-10-CM

## 2024-02-24 DIAGNOSIS — O09299 Supervision of pregnancy with other poor reproductive or obstetric history, unspecified trimester: Secondary | ICD-10-CM

## 2024-02-24 DIAGNOSIS — O09293 Supervision of pregnancy with other poor reproductive or obstetric history, third trimester: Secondary | ICD-10-CM

## 2024-02-24 DIAGNOSIS — O0933 Supervision of pregnancy with insufficient antenatal care, third trimester: Secondary | ICD-10-CM

## 2024-02-24 DIAGNOSIS — O10919 Unspecified pre-existing hypertension complicating pregnancy, unspecified trimester: Secondary | ICD-10-CM

## 2024-02-24 MED ORDER — NIFEDIPINE ER 60 MG PO TB24: 60.0000 mg | ORAL_TABLET | Freq: Every day | ORAL | 1 refills | Status: DC

## 2024-02-24 NOTE — Progress Notes (Signed)
 PRENATAL VISIT NOTE  Subjective:  Tamara Krause is a 28 y.o. [id number] at [redacted]w[redacted]d being seen today for ongoing prenatal care.  She is currently monitored for the following issues for this high-risk pregnancy and has Hyperemesis arising during pregnancy; Supervision of high risk pregnancy in second trimester; Pregnancy with history of cesarean section, antepartum; History of 2 cesarean sections; Chronic hypertension during pregnancy, antepartum; Marijuana use during pregnancy; Obesity affecting pregnancy, antepartum; History of severe preeclampsia in last pregnancy; Anemia in pregnancy; Hemoglobin C trait (HCC); Late prenatal care affecting pregnancy in third trimester; Insufficient prenatal care; and Fundal height low for dates in third trimester on their problem list.  Patient reports no complaints.  Contractions: Not present. Vag. Bleeding: None.  Movement: Present. Denies leaking of fluid.   The following portions of the patient's history were reviewed and updated as appropriate: allergies, current medications, past family history, past medical history, past social history, past surgical history and problem list.   Objective:   Vitals:   02/24/24 1509 02/24/24 1527  BP: (!) 165/95 137/84  Pulse: (!) 109 (!) 110  Weight: 173 lb (78.5 kg)     Fetal Status:   Fundal Height: 32 cm Movement: Present     General:  Alert, oriented and cooperative. Patient is in no acute distress.  Skin: Skin is warm and dry. No rash noted.   Cardiovascular: Normal heart rate noted  Respiratory: Normal respiratory effort, no problems with respiration noted  Abdomen: Soft, gravid, appropriate for gestational age.  Pain/Pressure: Present     Pelvic: Cervical exam deferred        Extremities: Normal range of motion.  Edema: None  Mental Status: Normal mood and affect. Normal behavior. Normal judgment and thought content.   Assessment and Plan:  Pregnancy: [id number] at [redacted]w[redacted]d 1. [redacted] weeks gestation of  pregnancy Patient hasn't been seen since 26wks. Patient able to do 28wk labs today. She states transportation is her issue.  Patient had [facility] R[facility] with [facility]. In that pregnancy she only had one vist at 28wks and also chtn on procardia. She presented at 39wks in early labor and plan for tolac but she had rpt due to fetal intolerance of labor remote from delivey; I reviewed this with her and her sister and they had no idea this happened. I asked them then why did they think they had the rpt c/s and they said they weren't sure.  I told her that based on everything I recommend delivery at 37wks. I d/w her re: tolac and rpt c/s and I told her I recommend a rpt c/s since she's had NRFHTs during all of her deliveries: indication for g1, needed forceps for g2 for this, g3 indication. Pt to consider  RTC in 1-2 days to go over delivery method.  - Glucose Tolerance, 1 Hour - GC/Chlamydia probe amp ([place])not at [facility] - Culture, beta strep (group b only) - [place] MFM OB DETAIL +14 WK; Future - Anemia Profile B - HIV antibody (with reflex) - RPR - Comprehensive metabolic panel  2. Anemia during pregnancy in third trimester (Primary)    Latest Ref Rng & Units 12/16/2023    3:00 PM 10/28/2023   11:15 AM 10/13/2023    6:21 AM  CBC  WBC 3.4 - 10.8 x10E3/uL 6.2  5.7  6.6   Hemoglobin 11.1 - 15.9 g/dL 9.5  9.8  43.3   Hematocrit 34.0 - 46.6 % 27.5  28.2  36.3   Platelets 150 - 450 x10E3/uL  286  355  316   - Anemia Profile B  3. Hemoglobin C trait (HCC)  4. Chronic hypertension during pregnancy, antepartum Patient states she ran out of pills 3 weeks ago. Refill  - NIFEdipine (ADALAT CC) 60 MG 24 hr tablet; Take 1 tablet (60 mg total) by mouth daily.  Dispense: 30 tablet; Refill: 1  5. History of 2 cesarean sections See above  6. History of severe preeclampsia in last pregnancy - NIFEdipine (ADALAT CC) 60 MG 24 hr tablet; Take 1 tablet (60 mg total) by mouth daily.  Dispense: 30 tablet; Refill:  1  7. Insufficient prenatal care in third trimester  8. Supervision of high risk pregnancy in second trimester Doesn't want btl. Unsure of contraception  9. Fundal height low for dates in third trimester Unable to get sooner u/s with mfm. Will send to mfm for u/s. Pt states she only had a bedside scan in October when she was admitted for ?seizure like activity due to electrolyte imbalance  10. Questionable seizure activity No since October Repeat K today.   Preterm labor symptoms and general obstetric precautions including but not limited to vaginal bleeding, contractions, leaking of fluid and fetal movement were reviewed in detail with the patient. Please refer to After Visit Summary for other counseling recommendations.   Return in about 1 day (around 02/25/2024) for in person, md visit, high risk ob.  Future Appointments  Date Time Provider Department Center  03/15/2024  9:00 AM [facility] NURSE [facility]  03/15/2024  9:15 AM [facility] PROVIDER 1 [facility]  03/15/2024  9:30 AM [facility] US2 [facility]    [place] Bing, MD

## 2024-02-25 LAB — ANEMIA PROFILE B
Basophils Absolute: 0 10*3/uL (ref 0.0–0.2)
Basos: 0 %
EOS (ABSOLUTE): 0.1 10*3/uL (ref 0.0–0.4)
Eos: 1 %
Ferritin: 14 ng/mL — ABNORMAL LOW (ref 15–150)
Folate: 10.7 ng/mL (ref >3.0)
Hematocrit: 26.4 % — ABNORMAL LOW (ref 34.0–46.6)
Hemoglobin: 9 g/dL — ABNORMAL LOW (ref 11.1–15.9)
Immature Grans (Abs): 0 10*3/uL (ref 0.0–0.1)
Immature Granulocytes: 1 %
Iron Saturation: 7 % — CL (ref 15–55)
Iron: 40 ug/dL (ref 27–159)
Lymphocytes Absolute: 2 10*3/uL (ref 0.7–3.1)
Lymphs: 31 %
MCH: 26.8 pg (ref 26.6–33.0)
MCHC: 34.1 g/dL (ref 31.5–35.7)
MCV: 79 fL (ref 79–97)
Monocytes Absolute: 0.4 10*3/uL (ref 0.1–0.9)
Monocytes: 6 %
Neutrophils Absolute: 3.9 10*3/uL (ref 1.4–7.0)
Neutrophils: 61 %
Platelets: 241 10*3/uL (ref 150–450)
RBC: 3.36 x10E6/uL — ABNORMAL LOW (ref 3.77–5.28)
RDW: 13.2 % (ref 11.7–15.4)
Retic Ct Pct: 1.6 % (ref 0.6–2.6)
Total Iron Binding Capacity: 578 ug/dL (ref 250–450)
UIBC: 538 ug/dL — ABNORMAL HIGH (ref 131–425)
Vitamin B-12: 183 pg/mL — ABNORMAL LOW (ref 232–1245)
WBC: 6.3 10*3/uL (ref 3.4–10.8)

## 2024-02-25 LAB — COMPREHENSIVE METABOLIC PANEL
ALT: 5 IU/L (ref 0–32)
AST: 14 IU/L (ref 0–40)
Albumin: 3.3 g/dL — ABNORMAL LOW (ref 4.0–5.0)
Alkaline Phosphatase: 114 IU/L (ref 44–121)
BUN/Creatinine Ratio: 11 (ref 9–23)
BUN: 6 mg/dL (ref 6–20)
Bilirubin Total: 0.3 mg/dL (ref 0.0–1.2)
CO2: 20 mmol/L (ref 20–29)
Calcium: 8.7 mg/dL (ref 8.7–10.2)
Chloride: 101 mmol/L (ref 96–106)
Creatinine, Ser: 0.54 mg/dL — ABNORMAL LOW (ref 0.57–1.00)
Globulin, Total: 3 g/dL (ref 1.5–4.5)
Glucose: 96 mg/dL (ref 70–99)
Potassium: 3.8 mmol/L (ref 3.5–5.2)
Sodium: 139 mmol/L (ref 134–144)
Total Protein: 6.3 g/dL (ref 6.0–8.5)
eGFR: 129 mL/min/{1.73_m2} (ref >59)

## 2024-02-25 LAB — GC/CHLAMYDIA PROBE AMP (~~LOC~~) NOT AT ARMC
Chlamydia: NEGATIVE
Comment: NEGATIVE
Comment: NORMAL
Neisseria Gonorrhea: NEGATIVE

## 2024-02-25 LAB — GLUCOSE TOLERANCE, 1 HOUR: Glucose, 1Hr PP: 104 mg/dL (ref 70–199)

## 2024-02-25 LAB — RPR: RPR Ser Ql: NONREACTIVE

## 2024-02-25 LAB — HIV ANTIBODY (ROUTINE TESTING W REFLEX): HIV Screen 4th Generation wRfx: NONREACTIVE

## 2024-02-28 LAB — CULTURE, BETA STREP (GROUP B ONLY): Strep Gp B Culture: NEGATIVE

## 2024-02-29 ENCOUNTER — Encounter: Payer: Self-pay | Admitting: Obstetrics and Gynecology

## 2024-03-03 ENCOUNTER — Other Ambulatory Visit: Payer: Self-pay

## 2024-03-03 ENCOUNTER — Ambulatory Visit: Payer: Medicaid Other | Admitting: Obstetrics & Gynecology

## 2024-03-03 ENCOUNTER — Inpatient Hospital Stay (HOSPITAL_COMMUNITY)

## 2024-03-03 ENCOUNTER — Inpatient Hospital Stay (HOSPITAL_COMMUNITY)
Admission: AD | Admit: 2024-03-03 | Discharge: 2024-03-07 | DRG: 787 | Disposition: A | Discharge status: Home / Self Care | Attending: Obstetrics and Gynecology | Admitting: Obstetrics and Gynecology

## 2024-03-03 ENCOUNTER — Encounter (HOSPITAL_COMMUNITY): Payer: Self-pay | Admitting: Obstetrics and Gynecology

## 2024-03-03 VITALS — BP 162/109 | HR 111 | Wt 178.0 lb

## 2024-03-03 DIAGNOSIS — O34211 Maternal care for low transverse scar from previous cesarean delivery: Secondary | ICD-10-CM | POA: Diagnosis present

## 2024-03-03 DIAGNOSIS — O0933 Supervision of pregnancy with insufficient antenatal care, third trimester: Secondary | ICD-10-CM | POA: Diagnosis not present

## 2024-03-03 DIAGNOSIS — D62 Acute posthemorrhagic anemia: Secondary | ICD-10-CM | POA: Diagnosis not present

## 2024-03-03 DIAGNOSIS — Z30430 Encounter for insertion of intrauterine contraceptive device: Secondary | ICD-10-CM

## 2024-03-03 DIAGNOSIS — O34219 Maternal care for unspecified type scar from previous cesarean delivery: Secondary | ICD-10-CM | POA: Diagnosis not present

## 2024-03-03 DIAGNOSIS — R030 Elevated blood-pressure reading, without diagnosis of hypertension: Secondary | ICD-10-CM | POA: Diagnosis present

## 2024-03-03 DIAGNOSIS — Z87891 Personal history of nicotine dependence: Secondary | ICD-10-CM

## 2024-03-03 DIAGNOSIS — Z3A37 37 weeks gestation of pregnancy: Secondary | ICD-10-CM | POA: Diagnosis not present

## 2024-03-03 DIAGNOSIS — O10913 Unspecified pre-existing hypertension complicating pregnancy, third trimester: Secondary | ICD-10-CM

## 2024-03-03 DIAGNOSIS — O10013 Pre-existing essential hypertension complicating pregnancy, third trimester: Secondary | ICD-10-CM

## 2024-03-03 DIAGNOSIS — O09293 Supervision of pregnancy with other poor reproductive or obstetric history, third trimester: Secondary | ICD-10-CM

## 2024-03-03 DIAGNOSIS — O9902 Anemia complicating childbirth: Secondary | ICD-10-CM | POA: Diagnosis present

## 2024-03-03 DIAGNOSIS — D509 Iron deficiency anemia, unspecified: Secondary | ICD-10-CM | POA: Diagnosis present

## 2024-03-03 DIAGNOSIS — O0992 Supervision of high risk pregnancy, unspecified, second trimester: Secondary | ICD-10-CM

## 2024-03-03 DIAGNOSIS — O114 Pre-existing hypertension with pre-eclampsia, complicating childbirth: Secondary | ICD-10-CM | POA: Diagnosis present

## 2024-03-03 DIAGNOSIS — Z98891 History of uterine scar from previous surgery: Secondary | ICD-10-CM

## 2024-03-03 DIAGNOSIS — O99320 Drug use complicating pregnancy, unspecified trimester: Secondary | ICD-10-CM

## 2024-03-03 DIAGNOSIS — O1414 Severe pre-eclampsia complicating childbirth: Secondary | ICD-10-CM | POA: Diagnosis not present

## 2024-03-03 DIAGNOSIS — O10919 Unspecified pre-existing hypertension complicating pregnancy, unspecified trimester: Secondary | ICD-10-CM

## 2024-03-03 DIAGNOSIS — F1290 Cannabis use, unspecified, uncomplicated: Secondary | ICD-10-CM

## 2024-03-03 DIAGNOSIS — O99210 Obesity complicating pregnancy, unspecified trimester: Secondary | ICD-10-CM

## 2024-03-03 DIAGNOSIS — E669 Obesity, unspecified: Secondary | ICD-10-CM

## 2024-03-03 DIAGNOSIS — D582 Other hemoglobinopathies: Secondary | ICD-10-CM

## 2024-03-03 DIAGNOSIS — O99213 Obesity complicating pregnancy, third trimester: Secondary | ICD-10-CM

## 2024-03-03 DIAGNOSIS — O119 Pre-existing hypertension with pre-eclampsia, unspecified trimester: Principal | ICD-10-CM | POA: Diagnosis present

## 2024-03-03 DIAGNOSIS — O1092 Unspecified pre-existing hypertension complicating childbirth: Secondary | ICD-10-CM | POA: Diagnosis present

## 2024-03-03 DIAGNOSIS — O0993 Supervision of high risk pregnancy, unspecified, third trimester: Secondary | ICD-10-CM | POA: Diagnosis not present

## 2024-03-03 LAB — PROTEIN / CREATININE RATIO, URINE
Creatinine, Urine: 353 mg/dL
Protein Creatinine Ratio: 0.23 mg/mg{creat} — ABNORMAL HIGH (ref 0.00–0.15)
Total Protein, Urine: 82 mg/dL

## 2024-03-03 LAB — COMPREHENSIVE METABOLIC PANEL
ALT: 9 U/L (ref 0–44)
AST: 18 U/L (ref 15–41)
Albumin: 2.6 g/dL — ABNORMAL LOW (ref 3.5–5.0)
Alkaline Phosphatase: 108 U/L (ref 38–126)
Anion gap: 14 (ref 5–15)
BUN: <5 mg/dL — ABNORMAL LOW (ref 6–20)
CO2: 20 mmol/L — ABNORMAL LOW (ref 22–32)
Calcium: 9.2 mg/dL (ref 8.9–10.3)
Chloride: 101 mmol/L (ref 98–111)
Creatinine, Ser: 0.7 mg/dL (ref 0.44–1.00)
GFR, Estimated: >60 mL/min (ref >60)
Glucose, Bld: 170 mg/dL — ABNORMAL HIGH (ref 70–99)
Potassium: 3.1 mmol/L — ABNORMAL LOW (ref 3.5–5.1)
Sodium: 135 mmol/L (ref 135–145)
Total Bilirubin: 0.6 mg/dL (ref 0.0–1.2)
Total Protein: 7 g/dL (ref 6.5–8.1)

## 2024-03-03 LAB — CBC
HCT: 27.1 % — ABNORMAL LOW (ref 36.0–46.0)
Hemoglobin: 9.2 g/dL — ABNORMAL LOW (ref 12.0–15.0)
MCH: 25.8 pg — ABNORMAL LOW (ref 26.0–34.0)
MCHC: 33.9 g/dL (ref 30.0–36.0)
MCV: 76.1 fL — ABNORMAL LOW (ref 80.0–100.0)
Platelets: 280 10*3/uL (ref 150–400)
RBC: 3.56 MIL/uL — ABNORMAL LOW (ref 3.87–5.11)
RDW: 13.2 % (ref 11.5–15.5)
WBC: 9.5 10*3/uL (ref 4.0–10.5)
nRBC: 0 % (ref 0.0–0.2)

## 2024-03-03 MED ORDER — HYDRALAZINE HCL 20 MG/ML IJ SOLN
5.0000 mg | INTRAMUSCULAR | Status: DC | PRN
  Filled 2024-03-03: qty 1

## 2024-03-03 MED ORDER — LACTATED RINGERS IV SOLN
INTRAVENOUS | Status: DC
Start: 2024-03-03 — End: 2024-03-04

## 2024-03-03 MED ORDER — LABETALOL HCL 5 MG/ML IV SOLN: 40.0000 mg | INTRAVENOUS | Status: DC | PRN

## 2024-03-03 MED ORDER — LABETALOL HCL 5 MG/ML IV SOLN: 20.0000 mg | INTRAVENOUS | Status: DC | PRN

## 2024-03-03 MED ORDER — FENTANYL CITRATE (PF) 100 MCG/2ML IJ SOLN
50.0000 ug | INTRAMUSCULAR | Status: DC | PRN
  Administered 2024-03-04: 50 ug via INTRAVENOUS
  Filled 2024-03-03: qty 2

## 2024-03-03 MED ORDER — SOD CITRATE-CITRIC ACID 500-334 MG/5ML PO SOLN
30.0000 mL | ORAL | Status: DC | PRN
  Administered 2024-03-04: 30 mL via ORAL
  Filled 2024-03-03: qty 30

## 2024-03-03 MED ORDER — LACTATED RINGERS IV SOLN: 500.0000 mL | INTRAVENOUS | Status: DC | PRN

## 2024-03-03 MED ORDER — MAGNESIUM SULFATE 40 GM/1000ML IV SOLN
2.0000 g/h | INTRAVENOUS | Status: AC
  Administered 2024-03-04: 2 g/h via INTRAVENOUS
  Filled 2024-03-03 (×2): qty 1000

## 2024-03-03 MED ORDER — OXYTOCIN-SODIUM CHLORIDE 30-0.9 UT/500ML-% IV SOLN: 2.5000 [IU]/h | INTRAVENOUS | Status: DC

## 2024-03-03 MED ORDER — LIDOCAINE HCL (PF) 1 % IJ SOLN: 30.0000 mL | INTRAMUSCULAR | Status: DC | PRN

## 2024-03-03 MED ORDER — HYDROXYZINE HCL 50 MG PO TABS: 50.0000 mg | ORAL_TABLET | Freq: Four times a day (QID) | ORAL | Status: DC | PRN

## 2024-03-03 MED ORDER — OXYTOCIN-SODIUM CHLORIDE 30-0.9 UT/500ML-% IV SOLN
1.0000 m[IU]/min | INTRAVENOUS | Status: DC
  Administered 2024-03-04: 2 m[IU]/min via INTRAVENOUS
  Administered 2024-03-04: 4 m[IU]/min via INTRAVENOUS
  Filled 2024-03-03: qty 500

## 2024-03-03 MED ORDER — TERBUTALINE SULFATE 1 MG/ML IJ SOLN: 0.2500 mg | Freq: Once | INTRAMUSCULAR | Status: DC | PRN

## 2024-03-03 MED ORDER — MAGNESIUM SULFATE BOLUS VIA INFUSION
6.0000 g | Freq: Once | INTRAVENOUS | Status: AC
  Administered 2024-03-03: 6 g via INTRAVENOUS
  Filled 2024-03-03: qty 1000

## 2024-03-03 MED ORDER — HYDRALAZINE HCL 20 MG/ML IJ SOLN: 10.0000 mg | INTRAMUSCULAR | Status: DC | PRN

## 2024-03-03 MED ORDER — OXYCODONE-ACETAMINOPHEN 5-325 MG PO TABS: 1.0000 | ORAL_TABLET | ORAL | Status: DC | PRN

## 2024-03-03 MED ORDER — ACETAMINOPHEN 325 MG PO TABS: 650.0000 mg | ORAL_TABLET | ORAL | Status: DC | PRN

## 2024-03-03 MED ORDER — ONDANSETRON HCL 4 MG/2ML IJ SOLN: 4.0000 mg | Freq: Four times a day (QID) | INTRAMUSCULAR | Status: DC | PRN

## 2024-03-03 MED ORDER — OXYTOCIN BOLUS FROM INFUSION: 333.0000 mL | Freq: Once | INTRAVENOUS | Status: DC

## 2024-03-03 MED ORDER — LACTATED RINGERS IV SOLN: INTRAVENOUS | Status: DC

## 2024-03-03 MED ORDER — OXYCODONE-ACETAMINOPHEN 5-325 MG PO TABS: 2.0000 | ORAL_TABLET | ORAL | Status: DC | PRN

## 2024-03-03 NOTE — MAU Note (Signed)
.  Tamara Krause is a 28 y.o. at [redacted]w[redacted]d here in MAU reporting HTN today. She was at the office about 1600 and told to come to the hospital. [facility] had to get some things done at home first. She has prescribed med for HTN but has not picked it up. No h/a. Saw some "stars" earlier at her appt. Some pelvic pressure. Denies LOF or VB. Good FM  Onset of complaint: ongoing Pain score: 0 Vitals:   03/03/24 2146 03/03/24 2148  BP:  (!) 168/85  Pulse: 95   Resp: 17   Temp: (!) 97.4 F (36.3 C)   SpO2: 100%      FHT: 141  Lab orders placed from triage: orders placed by provider

## 2024-03-03 NOTE — H&P (Signed)
 LABOR AND DELIVERY ADMISSION HISTORY AND PHYSICAL NOTE  Tamara Krause is a 28 y.o. female [phone] with IUP at [redacted]w[redacted]d presenting for severe range blood pressures.   Patient reports the fetal movement as active. Patient reports uterine contraction activity as none. Patient reports vaginal bleeding as none. Patient describes fluid per vagina as None.   Patient denies HA, RUQ pain, extremity swelling, CP, SOB. Endorses new spots in her vision over past week.  She plans on breast feeding. Her contraception plan is:  PP IUD .  Prenatal History/Complications: PNC at [facility] - Insufficient PNC, third trimester. Late start to care at 47 weeks - cHTN with hx of preE in prior pregnancy - Hx C/S, VBAC, C/S. 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/05/2024   Late prenatal care affecting pregnancy in third trimester 01/05/2024   History of severe preeclampsia in last pregnancy 12/16/2023   Pregnancy with history of cesarean section, antepartum 11/25/2023   History of 2 cesarean sections 11/25/2023   Chronic hypertension during pregnancy, antepartum 11/25/2023   Marijuana use during pregnancy 11/25/2023   Obesity affecting pregnancy, antepartum 11/25/2023   Supervision of high risk pregnancy in second trimester 10/29/2023   Hyperemesis arising during pregnancy 10/13/2023    Past Medical History: Past Medical History:  Diagnosis Date   Elevated serum creatinine    Hypertension    Severe preeclampsia     Past Surgical History: Past Surgical History:  Procedure Laterality Date   CESAREAN SECTION      Obstetrical History: OB History     Gravida  5   Para  3    Term  3   Preterm      AB  1   Living  3      SAB  1   IAB      Ectopic      Multiple      Live Births  3           Social History: Social History   Socioeconomic History   Marital status: Single    Spouse name: Not on file   Number of children: Not on file   Years of education: Not on file   Highest education level: Not on file  Occupational History   Not on file  Tobacco Use   Smoking status: Former    Current packs/day: 0.00    Types: Cigarettes    Quit date: 08/28/2023    Years since quitting: 0.5   Smokeless tobacco: Never  Vaping Use   Vaping status: Never Used  Substance and Sexual Activity   Alcohol use: No   Drug use: No   Sexual activity: Yes  Other Topics Concern   Not on file  Social History Narrative   Not on file   Social Drivers of Health   Financial Resource Strain: Low Risk  (03/04/2022)   Received from [facility]   Overall Financial Resource Strain (CARDIA)    Difficulty of Paying Living Expenses: Not very hard  Food Insecurity: No Food Insecurity (10/28/2023)   Hunger Vital Sign    Worried About Running Out of Food in the Last Year: Never true  Ran Out of Food in the Last Year: Never true  Transportation Needs: No Transportation Needs (10/28/2023)   PRAPARE - Administrator, Civil Service (Medical): No    Lack of Transportation (Non-Medical): No  Physical Activity: Not on file  Stress: Not on file  Social Connections: Not on file    Family History: No family history on file.  Allergies: No Known Allergies  Medications Prior to Admission  Medication Sig Dispense Refill Last Dose/Taking   acetaminophen (TYLENOL) 325 MG tablet Take 2 tablets (650 mg total) by mouth every 6 (six) hours as needed (for pain scale < 4  OR  temperature  >/=  100.5 F). 30 tablet 0 03/03/2024   Prenatal 28-0.8 MG TABS Take 1 tablet by mouth daily. 30 tablet 12 03/03/2024   aspirin EC 81 MG tablet Take 2 tablets (162 mg total) by  mouth daily. Swallow whole. (Patient not taking: Reported on 03/03/2024) 100 tablet 12    doxylamine, Sleep, (UNISOM) 25 MG tablet Take 1 tablet (25 mg total) by mouth at bedtime. (Patient not taking: Reported on 03/03/2024) 30 tablet 2    Ferric Maltol 30 MG CAPS Take 1 capsule (30 mg total) by mouth 2 (two) times daily. Please take one hour before breakfast and dinner 60 capsule 2    metoCLOPramide (REGLAN) 10 MG tablet Take 1 tablet (10 mg total) by mouth every 8 (eight) hours. 102 tablet 0    NIFEdipine (ADALAT CC) 60 MG 24 hr tablet Take 1 tablet (60 mg total) by mouth daily. 30 tablet 1 More than a month   potassium chloride SA (KLOR-CON M) 20 MEQ tablet Take 1 tablet (20 mEq total) by mouth daily. 30 tablet 0    promethazine (PHENERGAN) 25 MG tablet Take 1 tablet (25 mg total) by mouth every 6 (six) hours as needed for nausea or vomiting. Take 4 hours after reglan or ondansetron if still nauseous (Patient not taking: Reported on 03/03/2024) 30 tablet 2      Review of Systems  All systems reviewed and negative except as stated in HPI  Physical Exam BP (!) 151/75   Pulse 96   Temp (!) 97.4 F (36.3 C)   Resp 17   Ht 5\' 4"  (1.626 m)   Wt 80.7 kg   LMP 06/13/2023 (Approximate) Comment: patient reports she is 2-[redacted] weeks pregnant; EDD approx based on approx LMP  SpO2 98%   BMI 30.55 kg/m   Physical Exam Constitutional:      General: She is not in acute distress.    Appearance: She is not ill-appearing.  Cardiovascular:     Rate and Rhythm: Normal rate and regular rhythm.  Pulmonary:     Effort: Pulmonary effort is normal.     Breath sounds: Normal breath sounds.  Abdominal:     Comments: Gravid  Musculoskeletal:        General: No swelling.  Skin:    General: Skin is warm and dry.  Neurological:     General: No focal deficit present.  Psychiatric:        Mood and Affect: Mood normal.   Presentation: cephalic by US  Fetal monitoring: Baseline: 130 bpm, Variability: Good {> 6  bpm), Accelerations: Reactive, and Decelerations: Absent Uterine activity: None  Cervix 1/40/ballotable in office today  Prenatal labs: ABO, Rh: --/--/PENDING (03/06 2218) Antibody: PENDING (03/06 2218) Rubella: 1.51 (10/30 1115) RPR: Non Reactive (02/26 1554)  HBsAg: Negative (10/30 1115)  HIV: Non Reactive (02/26 1554)  GC/Chlamydia:  Neisseria Gonorrhea  Date Value Ref Range Status  02/24/2024 Negative  Final   Chlamydia  Date Value Ref Range Status  02/24/2024 Negative  Final   GBS: Negative/-- (02/26 1604)   Prenatal Transfer Tool  Maternal Diabetes: No Genetic Screening: Not obtained Maternal Ultrasounds/Referrals: None. BSUS to confirm IUP first trimester Fetal Ultrasounds or other Referrals:  None Maternal Substance Abuse:  No Significant Maternal Medications: NOT taking ASA, nifedipine as prescribed Significant Maternal Lab Results: Group B Strep negative  Results for orders placed or performed during the hospital encounter of 03/03/24 (from the past 24 hours)  Protein / creatinine ratio, urine   Collection Time: 03/03/24  9:58 PM  Result Value Ref Range   Creatinine, Urine 353 mg/dL   Total Protein, Urine 82 mg/dL   Protein Creatinine Ratio 0.23 (H) 0.00 - 0.15 mg/mg[Cre]  CBC   Collection Time: 03/03/24 10:18 PM  Result Value Ref Range   WBC 9.5 4.0 - 10.5 K/uL   RBC 3.56 (L) 3.87 - 5.11 MIL/uL   Hemoglobin 9.2 (L) 12.0 - 15.0 g/dL   HCT 44.0 (L) 10.2 - 72.5 %   MCV 76.1 (L) 80.0 - 100.0 fL   MCH 25.8 (L) 26.0 - 34.0 pg   MCHC 33.9 30.0 - 36.0 g/dL   RDW 36.6 44.0 - 34.7 %   Platelets 280 150 - 400 K/uL   nRBC 0.0 0.0 - 0.2 %  Type and screen [facility]   Collection Time: 03/03/24 10:18 PM  Result Value Ref Range   ABO/RH(D) PENDING    Antibody Screen PENDING    Sample Expiration      03/06/2024,2359 Performed at [facility], [address] [place], [place] [address]     Assessment: Tamara Krause is a 28 y.o. [phone]  at [redacted]w[redacted]d here for severe range BP in office. Meets criteria for cHTN w/SIPE. Will admit to L&D for IOL.  #Labor: Plan for balloon, pit, AROM PRN #Pain: IV pain meds PRN, epidural upon request #FHT: Category I #GBS/ID: Negative #MOF: breast feeding #MOC:  PP IUD  #cHTN w/SIPE: meeting severe criteria by pressures. Will start on mag 6 g bolus/ 2 g/hr after. PRN IV for severes. Start Procardia XL 30 mg every day  #TOLAC: Patient has hx of C/S, VBAC, C/S. Both C/S for fetal intolerance. Discussed R/B of TOLAC and repeat CS, she elects for TOLAC. Appropriate consent signed. [place] confirmed anterior placenta without previa or signs of complication with previous uterine scar  Joanne Gavel, MD [facility] Fellow Center for [facility], [facility]  03/03/2024, 11:01 PM

## 2024-03-03 NOTE — Progress Notes (Signed)
   PRENATAL VISIT NOTE  Subjective:  Tamara Krause is a 28 y.o. [id number] at [redacted]w[redacted]d being seen today for ongoing prenatal care.  She is currently monitored for the following issues for this high-risk pregnancy and has Hyperemesis arising during pregnancy; Supervision of high risk pregnancy in second trimester; Pregnancy with history of cesarean section, antepartum; History of 2 cesarean sections; Chronic hypertension during pregnancy, antepartum; Marijuana use during pregnancy; Obesity affecting pregnancy, antepartum; History of severe preeclampsia in last pregnancy; Iron deficiency anemia of mother during pregnancy; Hemoglobin C trait (HCC); Late prenatal care affecting pregnancy in third trimester; Insufficient prenatal care; and Fundal height low for dates in third trimester on their problem list.  Patient reports occasional contractions.  Contractions: Not present. Vag. Bleeding: None.  Movement: Present. Denies leaking of fluid.   The following portions of the patient's history were reviewed and updated as appropriate: allergies, current medications, past family history, past medical history, past social history, past surgical history and problem list.   Objective:   Vitals:   03/03/24 1611 03/03/24 1630  BP: (!) 165/96 (!) 162/109  Pulse: (!) 121 (!) 111  Weight: 178 lb (80.7 kg)     Fetal Status: Fetal Heart Rate (bpm): 135   Movement: Present  Presentation: Vertex  General:  Alert, oriented and cooperative. Patient is in no acute distress.  Skin: Skin is warm and dry. No rash noted.   Cardiovascular: Normal heart rate noted  Respiratory: Normal respiratory effort, no problems with respiration noted  Abdomen: Soft, gravid, appropriate for gestational age.  Pain/Pressure: Absent     Pelvic: Cervical exam deferred Dilation: 1 Effacement (%): 40 Station: Ballotable  Extremities: Normal range of motion.  Edema: None  Mental Status: Normal mood and affect. Normal behavior. Normal judgment  and thought content.   Assessment and Plan:  Pregnancy: [id number] at [redacted]w[redacted]d 1. [redacted] weeks gestation of pregnancy (Primary) CHTN uncontrolled possible superimposed preeclampsia  2. Supervision of high risk pregnancy in second trimester   3. Insufficient prenatal care in third trimester   4. Late prenatal care affecting pregnancy in third trimester To MAU for evaluation and plan for delivery. She requests TOLAC  Term labor symptoms and general obstetric precautions including but not limited to vaginal bleeding, contractions, leaking of fluid and fetal movement were reviewed in detail with the patient. Please refer to After Visit Summary for other counseling recommendations.   Return in about 1 week (around 03/10/2024).  Future Appointments  Date Time Provider Department Center  03/15/2024  9:00 AM [facility] NURSE [facility]  03/15/2024  9:15 AM [facility] PROVIDER 1 [facility]  03/15/2024  9:30 AM [facility] US2 [facility]    Scheryl Darter, MD

## 2024-03-04 ENCOUNTER — Inpatient Hospital Stay (HOSPITAL_COMMUNITY): Admitting: Anesthesiology

## 2024-03-04 ENCOUNTER — Encounter (HOSPITAL_COMMUNITY)
Admission: AD | Disposition: A | Discharge status: Home / Self Care | Payer: Self-pay | Source: Home / Self Care | Attending: Obstetrics and Gynecology

## 2024-03-04 ENCOUNTER — Encounter (HOSPITAL_COMMUNITY): Payer: Self-pay | Admitting: Obstetrics and Gynecology

## 2024-03-04 DIAGNOSIS — Z3A37 37 weeks gestation of pregnancy: Secondary | ICD-10-CM

## 2024-03-04 DIAGNOSIS — O34211 Maternal care for low transverse scar from previous cesarean delivery: Secondary | ICD-10-CM | POA: Diagnosis not present

## 2024-03-04 DIAGNOSIS — O1414 Severe pre-eclampsia complicating childbirth: Secondary | ICD-10-CM | POA: Diagnosis not present

## 2024-03-04 DIAGNOSIS — Z30430 Encounter for insertion of intrauterine contraceptive device: Secondary | ICD-10-CM | POA: Diagnosis not present

## 2024-03-04 DIAGNOSIS — O0933 Supervision of pregnancy with insufficient antenatal care, third trimester: Secondary | ICD-10-CM | POA: Diagnosis not present

## 2024-03-04 LAB — COMPREHENSIVE METABOLIC PANEL
ALT: 8 U/L (ref 0–44)
ALT: 7 U/L (ref 0–44)
ALT: 9 U/L (ref 0–44)
AST: 14 U/L — ABNORMAL LOW (ref 15–41)
AST: 11 U/L — ABNORMAL LOW (ref 15–41)
AST: 19 U/L (ref 15–41)
Albumin: 2.6 g/dL — ABNORMAL LOW (ref 3.5–5.0)
Albumin: 2.3 g/dL — ABNORMAL LOW (ref 3.5–5.0)
Albumin: 2.4 g/dL — ABNORMAL LOW (ref 3.5–5.0)
Alkaline Phosphatase: 102 U/L (ref 38–126)
Alkaline Phosphatase: 94 U/L (ref 38–126)
Alkaline Phosphatase: 101 U/L (ref 38–126)
Anion gap: 10 (ref 5–15)
Anion gap: 8 (ref 5–15)
Anion gap: 7 (ref 5–15)
BUN: <5 mg/dL — ABNORMAL LOW (ref 6–20)
BUN: <5 mg/dL — ABNORMAL LOW (ref 6–20)
BUN: <5 mg/dL — ABNORMAL LOW (ref 6–20)
CO2: 21 mmol/L — ABNORMAL LOW (ref 22–32)
CO2: 23 mmol/L (ref 22–32)
CO2: 22 mmol/L (ref 22–32)
Calcium: 7.4 mg/dL — ABNORMAL LOW (ref 8.9–10.3)
Calcium: 7.2 mg/dL — ABNORMAL LOW (ref 8.9–10.3)
Calcium: 6.7 mg/dL — ABNORMAL LOW (ref 8.9–10.3)
Chloride: 101 mmol/L (ref 98–111)
Chloride: 103 mmol/L (ref 98–111)
Chloride: 101 mmol/L (ref 98–111)
Creatinine, Ser: 0.57 mg/dL (ref 0.44–1.00)
Creatinine, Ser: 0.5 mg/dL (ref 0.44–1.00)
Creatinine, Ser: 0.63 mg/dL (ref 0.44–1.00)
GFR, Estimated: >60 mL/min (ref >60)
GFR, Estimated: >60 mL/min (ref >60)
GFR, Estimated: >60 mL/min (ref >60)
Glucose, Bld: 104 mg/dL — ABNORMAL HIGH (ref 70–99)
Glucose, Bld: 99 mg/dL (ref 70–99)
Glucose, Bld: 158 mg/dL — ABNORMAL HIGH (ref 70–99)
Potassium: 3.3 mmol/L — ABNORMAL LOW (ref 3.5–5.1)
Potassium: 3.8 mmol/L (ref 3.5–5.1)
Potassium: 3.9 mmol/L (ref 3.5–5.1)
Sodium: 132 mmol/L — ABNORMAL LOW (ref 135–145)
Sodium: 134 mmol/L — ABNORMAL LOW (ref 135–145)
Sodium: 130 mmol/L — ABNORMAL LOW (ref 135–145)
Total Bilirubin: 0.4 mg/dL (ref 0.0–1.2)
Total Bilirubin: 0.4 mg/dL (ref 0.0–1.2)
Total Bilirubin: 0.5 mg/dL (ref 0.0–1.2)
Total Protein: 6.4 g/dL — ABNORMAL LOW (ref 6.5–8.1)
Total Protein: 5.8 g/dL — ABNORMAL LOW (ref 6.5–8.1)
Total Protein: 6.4 g/dL — ABNORMAL LOW (ref 6.5–8.1)

## 2024-03-04 LAB — CBC
HCT: 27.4 % — ABNORMAL LOW (ref 36.0–46.0)
HCT: 25.3 % — ABNORMAL LOW (ref 36.0–46.0)
HCT: 25 % — ABNORMAL LOW (ref 36.0–46.0)
Hemoglobin: 9.5 g/dL — ABNORMAL LOW (ref 12.0–15.0)
Hemoglobin: 8.9 g/dL — ABNORMAL LOW (ref 12.0–15.0)
Hemoglobin: 8.6 g/dL — ABNORMAL LOW (ref 12.0–15.0)
MCH: 26 pg (ref 26.0–34.0)
MCH: 26.5 pg (ref 26.0–34.0)
MCH: 26 pg (ref 26.0–34.0)
MCHC: 34.7 g/dL (ref 30.0–36.0)
MCHC: 35.2 g/dL (ref 30.0–36.0)
MCHC: 34.4 g/dL (ref 30.0–36.0)
MCV: 74.9 fL — ABNORMAL LOW (ref 80.0–100.0)
MCV: 75.3 fL — ABNORMAL LOW (ref 80.0–100.0)
MCV: 75.5 fL — ABNORMAL LOW (ref 80.0–100.0)
Platelets: 293 10*3/uL (ref 150–400)
Platelets: 243 10*3/uL (ref 150–400)
Platelets: 268 10*3/uL (ref 150–400)
RBC: 3.66 MIL/uL — ABNORMAL LOW (ref 3.87–5.11)
RBC: 3.36 MIL/uL — ABNORMAL LOW (ref 3.87–5.11)
RBC: 3.31 MIL/uL — ABNORMAL LOW (ref 3.87–5.11)
RDW: 13.2 % (ref 11.5–15.5)
RDW: 13.2 % (ref 11.5–15.5)
RDW: 13.2 % (ref 11.5–15.5)
WBC: 10.4 10*3/uL (ref 4.0–10.5)
WBC: 8.3 10*3/uL (ref 4.0–10.5)
WBC: 11.8 10*3/uL — ABNORMAL HIGH (ref 4.0–10.5)
nRBC: 0 % (ref 0.0–0.2)
nRBC: 0 % (ref 0.0–0.2)
nRBC: 0 % (ref 0.0–0.2)

## 2024-03-04 LAB — RPR: RPR Ser Ql: NONREACTIVE

## 2024-03-04 LAB — PREPARE RBC (CROSSMATCH)

## 2024-03-04 SURGERY — Surgical Case
Anesthesia: Epidural | Site: Abdomen

## 2024-03-04 MED ORDER — FENTANYL CITRATE (PF) 100 MCG/2ML IJ SOLN
INTRAMUSCULAR | Status: AC
  Filled 2024-03-04: qty 2

## 2024-03-04 MED ORDER — WITCH HAZEL-GLYCERIN EX PADS: 1.0000 | MEDICATED_PAD | CUTANEOUS | Status: DC | PRN

## 2024-03-04 MED ORDER — NALOXONE HCL 4 MG/10ML IJ SOLN: 1.0000 ug/kg/h | INTRAVENOUS | Status: DC | PRN

## 2024-03-04 MED ORDER — DEXMEDETOMIDINE HCL IN NACL 80 MCG/20ML IV SOLN
INTRAVENOUS | Status: AC
Start: 2024-03-04 — End: ?
  Filled 2024-03-04: qty 20

## 2024-03-04 MED ORDER — EPHEDRINE 5 MG/ML INJ: 10.0000 mg | INTRAVENOUS | Status: DC | PRN

## 2024-03-04 MED ORDER — TRANEXAMIC ACID-NACL 1000-0.7 MG/100ML-% IV SOLN
INTRAVENOUS | Status: DC | PRN
Start: 2024-03-04 — End: 2024-03-04
  Administered 2024-03-04: 1000 mg via INTRAVENOUS

## 2024-03-04 MED ORDER — ONDANSETRON HCL 4 MG/2ML IJ SOLN: 4.0000 mg | Freq: Three times a day (TID) | INTRAMUSCULAR | Status: DC | PRN

## 2024-03-04 MED ORDER — COCONUT OIL OIL
1.0000 | TOPICAL_OIL | Status: DC | PRN
  Administered 2024-03-05 – 2024-03-07 (×2): 1 via TOPICAL

## 2024-03-04 MED ORDER — MENTHOL 3 MG MT LOZG: 1.0000 | LOZENGE | OROMUCOSAL | Status: DC | PRN

## 2024-03-04 MED ORDER — ONDANSETRON HCL 4 MG/2ML IJ SOLN
INTRAMUSCULAR | Status: DC | PRN
  Administered 2024-03-04: 4 mg via INTRAVENOUS

## 2024-03-04 MED ORDER — LACTATED RINGERS IV SOLN: 500.0000 mL | Freq: Once | INTRAVENOUS | Status: DC

## 2024-03-04 MED ORDER — LACTATED RINGERS IV SOLN
500.0000 mL | Freq: Once | INTRAVENOUS | Status: AC
  Administered 2024-03-04: 500 mL via INTRAVENOUS

## 2024-03-04 MED ORDER — DIPHENHYDRAMINE HCL 25 MG PO CAPS: 25.0000 mg | ORAL_CAPSULE | Freq: Four times a day (QID) | ORAL | Status: DC | PRN

## 2024-03-04 MED ORDER — PHENYLEPHRINE 80 MCG/ML (10ML) SYRINGE FOR IV PUSH (FOR BLOOD PRESSURE SUPPORT): 80.0000 ug | PREFILLED_SYRINGE | INTRAVENOUS | Status: DC | PRN

## 2024-03-04 MED ORDER — MORPHINE SULFATE (PF) 0.5 MG/ML IJ SOLN
INTRAMUSCULAR | Status: AC
  Filled 2024-03-04: qty 10

## 2024-03-04 MED ORDER — POTASSIUM CHLORIDE CRYS ER 20 MEQ PO TBCR
20.0000 meq | EXTENDED_RELEASE_TABLET | Freq: Once | ORAL | Status: AC
  Administered 2024-03-04: 20 meq via ORAL
  Filled 2024-03-04: qty 1

## 2024-03-04 MED ORDER — IBUPROFEN 600 MG PO TABS
600.0000 mg | ORAL_TABLET | Freq: Four times a day (QID) | ORAL | Status: DC
  Administered 2024-03-04 – 2024-03-07 (×11): 600 mg via ORAL
  Filled 2024-03-04 (×11): qty 1

## 2024-03-04 MED ORDER — PRENATAL MULTIVITAMIN CH
1.0000 | ORAL_TABLET | Freq: Every day | ORAL | Status: DC
  Administered 2024-03-04 – 2024-03-07 (×4): 1 via ORAL
  Filled 2024-03-04 (×4): qty 1

## 2024-03-04 MED ORDER — FENTANYL-BUPIVACAINE-NACL 0.5-0.125-0.9 MG/250ML-% EP SOLN
12.0000 mL/h | EPIDURAL | Status: DC | PRN
  Administered 2024-03-04: 12 mL/h via EPIDURAL
  Filled 2024-03-04: qty 250

## 2024-03-04 MED ORDER — LEVONORGESTREL 20 MCG/DAY IU IUD
INTRAUTERINE_SYSTEM | INTRAUTERINE | Status: AC
Start: 2024-03-04 — End: ?
  Filled 2024-03-04: qty 1

## 2024-03-04 MED ORDER — ACETAMINOPHEN 500 MG PO TABS
1000.0000 mg | ORAL_TABLET | Freq: Four times a day (QID) | ORAL | Status: AC
  Administered 2024-03-04 – 2024-03-05 (×4): 1000 mg via ORAL
  Filled 2024-03-04 (×4): qty 2

## 2024-03-04 MED ORDER — DIPHENHYDRAMINE HCL 50 MG/ML IJ SOLN: 12.5000 mg | INTRAMUSCULAR | Status: DC | PRN

## 2024-03-04 MED ORDER — SENNOSIDES-DOCUSATE SODIUM 8.6-50 MG PO TABS: 2.0000 | ORAL_TABLET | Freq: Every evening | ORAL | Status: DC | PRN

## 2024-03-04 MED ORDER — PHENYLEPHRINE 80 MCG/ML (10ML) SYRINGE FOR IV PUSH (FOR BLOOD PRESSURE SUPPORT)
PREFILLED_SYRINGE | INTRAVENOUS | Status: AC
  Filled 2024-03-04: qty 10

## 2024-03-04 MED ORDER — FENTANYL CITRATE (PF) 100 MCG/2ML IJ SOLN
25.0000 ug | INTRAMUSCULAR | Status: DC | PRN
  Administered 2024-03-04: 25 ug via INTRAVENOUS

## 2024-03-04 MED ORDER — SODIUM CHLORIDE 0.9 % IR SOLN
Status: DC | PRN
  Administered 2024-03-04: 1000 mL

## 2024-03-04 MED ORDER — MORPHINE SULFATE (PF) 0.5 MG/ML IJ SOLN
INTRAMUSCULAR | Status: DC | PRN
  Administered 2024-03-04: 3 mg via EPIDURAL

## 2024-03-04 MED ORDER — KETOROLAC TROMETHAMINE 30 MG/ML IJ SOLN: 30.0000 mg | Freq: Four times a day (QID) | INTRAMUSCULAR | Status: AC | PRN

## 2024-03-04 MED ORDER — POTASSIUM CHLORIDE CRYS ER 20 MEQ PO TBCR
20.0000 meq | EXTENDED_RELEASE_TABLET | Freq: Every day | ORAL | Status: DC
Start: 2024-03-05 — End: 2024-03-07
  Administered 2024-03-05 – 2024-03-07 (×3): 20 meq via ORAL
  Filled 2024-03-04 (×3): qty 1

## 2024-03-04 MED ORDER — KETOROLAC TROMETHAMINE 30 MG/ML IJ SOLN
INTRAMUSCULAR | Status: AC
Start: 2024-03-04 — End: ?
  Filled 2024-03-04: qty 1

## 2024-03-04 MED ORDER — DEXMEDETOMIDINE HCL IN NACL 80 MCG/20ML IV SOLN
INTRAVENOUS | Status: DC | PRN
Start: 2024-03-04 — End: 2024-03-04
  Administered 2024-03-04: 8 ug via INTRAVENOUS
  Administered 2024-03-04: 12 ug via INTRAVENOUS

## 2024-03-04 MED ORDER — ACETAMINOPHEN 10 MG/ML IV SOLN
1000.0000 mg | Freq: Once | INTRAVENOUS | Status: AC
  Administered 2024-03-04: 1000 mg via INTRAVENOUS

## 2024-03-04 MED ORDER — LIDOCAINE-EPINEPHRINE (PF) 2 %-1:200000 IJ SOLN
INTRAMUSCULAR | Status: AC
  Filled 2024-03-04: qty 20

## 2024-03-04 MED ORDER — DROPERIDOL 2.5 MG/ML IJ SOLN: 0.6250 mg | Freq: Once | INTRAMUSCULAR | Status: DC | PRN

## 2024-03-04 MED ORDER — SIMETHICONE 80 MG PO CHEW
80.0000 mg | CHEWABLE_TABLET | Freq: Three times a day (TID) | ORAL | Status: DC
  Administered 2024-03-04 – 2024-03-07 (×7): 80 mg via ORAL
  Filled 2024-03-04 (×7): qty 1

## 2024-03-04 MED ORDER — LACTATED RINGERS IV SOLN: INTRAVENOUS | Status: AC

## 2024-03-04 MED ORDER — NIFEDIPINE ER OSMOTIC RELEASE 30 MG PO TB24
30.0000 mg | ORAL_TABLET | Freq: Every day | ORAL | Status: DC
  Administered 2024-03-05: 30 mg via ORAL
  Filled 2024-03-04 (×2): qty 1

## 2024-03-04 MED ORDER — SODIUM CHLORIDE 0.9% FLUSH
3.0000 mL | INTRAVENOUS | Status: DC | PRN
  Administered 2024-03-05: 3 mL via INTRAVENOUS

## 2024-03-04 MED ORDER — LEVONORGESTREL 20 MCG/DAY IU IUD
1.0000 | INTRAUTERINE_SYSTEM | Freq: Once | INTRAUTERINE | Status: AC
  Administered 2024-03-04: 1 via INTRAUTERINE

## 2024-03-04 MED ORDER — ENOXAPARIN SODIUM 40 MG/0.4ML IJ SOSY
40.0000 mg | PREFILLED_SYRINGE | INTRAMUSCULAR | Status: DC
  Administered 2024-03-05 – 2024-03-07 (×3): 40 mg via SUBCUTANEOUS
  Filled 2024-03-04 (×3): qty 0.4

## 2024-03-04 MED ORDER — CEFAZOLIN SODIUM-DEXTROSE 2-3 GM-%(50ML) IV SOLR
INTRAVENOUS | Status: DC | PRN
  Administered 2024-03-04: 2 g via INTRAVENOUS

## 2024-03-04 MED ORDER — OXYTOCIN-SODIUM CHLORIDE 30-0.9 UT/500ML-% IV SOLN
INTRAVENOUS | Status: DC | PRN
  Administered 2024-03-04: 30 [IU] via INTRAVENOUS

## 2024-03-04 MED ORDER — STERILE WATER FOR IRRIGATION IR SOLN
Status: DC | PRN
  Administered 2024-03-04: 1000 mL

## 2024-03-04 MED ORDER — DIPHENHYDRAMINE HCL 25 MG PO CAPS: 25.0000 mg | ORAL_CAPSULE | ORAL | Status: DC | PRN

## 2024-03-04 MED ORDER — ACETAMINOPHEN 10 MG/ML IV SOLN
INTRAVENOUS | Status: AC
  Filled 2024-03-04: qty 100

## 2024-03-04 MED ORDER — SODIUM CHLORIDE 0.9 % IV SOLN
INTRAVENOUS | Status: DC | PRN
  Administered 2024-03-04: 500 mg via INTRAVENOUS

## 2024-03-04 MED ORDER — OXYCODONE HCL 5 MG PO TABS
5.0000 mg | ORAL_TABLET | ORAL | Status: DC | PRN
  Administered 2024-03-05 – 2024-03-06 (×2): 5 mg via ORAL
  Administered 2024-03-06: 10 mg via ORAL
  Filled 2024-03-04: qty 1
  Filled 2024-03-04: qty 2
  Filled 2024-03-04 (×2): qty 1

## 2024-03-04 MED ORDER — PHENYLEPHRINE 80 MCG/ML (10ML) SYRINGE FOR IV PUSH (FOR BLOOD PRESSURE SUPPORT)
PREFILLED_SYRINGE | INTRAVENOUS | Status: DC | PRN
  Administered 2024-03-04: 80 ug via INTRAVENOUS

## 2024-03-04 MED ORDER — LIDOCAINE-EPINEPHRINE (PF) 2 %-1:200000 IJ SOLN
INTRAMUSCULAR | Status: DC | PRN
  Administered 2024-03-04: 10 mL via EPIDURAL
  Administered 2024-03-04: 4 mL via EPIDURAL
  Administered 2024-03-04: 3 mL via EPIDURAL

## 2024-03-04 MED ORDER — POLYETHYLENE GLYCOL 3350 17 G PO PACK
17.0000 g | PACK | Freq: Every day | ORAL | Status: DC
  Administered 2024-03-05 – 2024-03-07 (×3): 17 g via ORAL
  Filled 2024-03-04 (×3): qty 1

## 2024-03-04 MED ORDER — NALOXONE HCL 0.4 MG/ML IJ SOLN: 0.4000 mg | INTRAMUSCULAR | Status: DC | PRN

## 2024-03-04 MED ORDER — DIBUCAINE (PERIANAL) 1 % EX OINT: 1.0000 | TOPICAL_OINTMENT | CUTANEOUS | Status: DC | PRN

## 2024-03-04 MED ORDER — FENTANYL-BUPIVACAINE-NACL 0.5-0.125-0.9 MG/250ML-% EP SOLN: 12.0000 mL/h | EPIDURAL | Status: DC | PRN

## 2024-03-04 MED ORDER — DEXAMETHASONE SODIUM PHOSPHATE 10 MG/ML IJ SOLN
INTRAMUSCULAR | Status: DC | PRN
Start: 2024-03-04 — End: 2024-03-04
  Administered 2024-03-04: 10 mg via INTRAVENOUS

## 2024-03-04 MED ORDER — GABAPENTIN 300 MG PO CAPS
300.0000 mg | ORAL_CAPSULE | Freq: Two times a day (BID) | ORAL | Status: DC
  Administered 2024-03-04 – 2024-03-07 (×7): 300 mg via ORAL
  Filled 2024-03-04 (×7): qty 1

## 2024-03-04 MED ORDER — OXYTOCIN-SODIUM CHLORIDE 30-0.9 UT/500ML-% IV SOLN
INTRAVENOUS | Status: AC
  Filled 2024-03-04: qty 500

## 2024-03-04 MED ORDER — OXYTOCIN-SODIUM CHLORIDE 30-0.9 UT/500ML-% IV SOLN: 2.5000 [IU]/h | INTRAVENOUS | Status: AC

## 2024-03-04 MED ORDER — KETOROLAC TROMETHAMINE 30 MG/ML IJ SOLN
30.0000 mg | Freq: Once | INTRAMUSCULAR | Status: AC
  Administered 2024-03-04: 30 mg via INTRAVENOUS

## 2024-03-04 MED ORDER — SIMETHICONE 80 MG PO CHEW: 80.0000 mg | CHEWABLE_TABLET | ORAL | Status: DC | PRN

## 2024-03-04 MED ORDER — FUROSEMIDE 20 MG PO TABS
20.0000 mg | ORAL_TABLET | Freq: Every day | ORAL | Status: DC
  Administered 2024-03-05 – 2024-03-07 (×3): 20 mg via ORAL
  Filled 2024-03-04 (×3): qty 1

## 2024-03-04 SURGICAL SUPPLY — 29 items
BENZOIN TINCTURE PRP APPL 2/3 (GAUZE/BANDAGES/DRESSINGS) ×1 IMPLANT
CANISTER SUCT 3000ML PPV (MISCELLANEOUS) ×1 IMPLANT
CHLORAPREP W/TINT 26 (MISCELLANEOUS) ×2 IMPLANT
CLAMP UMBILICAL CORD (MISCELLANEOUS) ×1 IMPLANT
DRSG OPSITE POSTOP 4X10 (GAUZE/BANDAGES/DRESSINGS) ×1 IMPLANT
ELECT REM PT RETURN 9FT ADLT (ELECTROSURGICAL) ×1 IMPLANT
ELECTRODE REM PT RTRN 9FT ADLT (ELECTROSURGICAL) ×1 IMPLANT
EXTRACTOR VACUUM KIWI (MISCELLANEOUS) ×1 IMPLANT
GLOVE BIOGEL PI IND STRL 7.0 (GLOVE) ×2 IMPLANT
GLOVE BIOGEL PI IND STRL 7.5 (GLOVE) ×1 IMPLANT
GLOVE SURG SS PI 7.0 STRL IVOR (GLOVE) ×1 IMPLANT
GOWN STRL REUS W/ TWL LRG LVL3 (GOWN DISPOSABLE) ×2 IMPLANT
GOWN STRL REUS W/ TWL XL LVL3 (GOWN DISPOSABLE) ×1 IMPLANT
HEMOSTAT ARISTA ABSORB 3G PWDR (HEMOSTASIS) IMPLANT
NS IRRIG 1000ML POUR BTL (IV SOLUTION) ×1 IMPLANT
PACK C SECTION WH (CUSTOM PROCEDURE TRAY) ×1 IMPLANT
PAD ABD 7.5X8 STRL (GAUZE/BANDAGES/DRESSINGS) ×1 IMPLANT
PAD OB MATERNITY 4.3X12.25 (PERSONAL CARE ITEMS) ×1 IMPLANT
PAD PREP 24X48 CUFFED NSTRL (MISCELLANEOUS) ×1 IMPLANT
RETRACTOR WND ALEXIS 25 LRG (MISCELLANEOUS) ×1 IMPLANT
RTRCTR WOUND ALEXIS 25CM LRG (MISCELLANEOUS) ×1 IMPLANT
STRIP CLOSURE SKIN 1/2X4 (GAUZE/BANDAGES/DRESSINGS) ×1 IMPLANT
SUT MNCRL 0 VIOLET CTX 36 (SUTURE) ×2 IMPLANT
SUT MON AB 4-0 PS1 27 (SUTURE) ×1 IMPLANT
SUT PLAIN 2 0 XLH (SUTURE) ×1 IMPLANT
SUT VIC AB 0 CT1 36 (SUTURE) ×2 IMPLANT
SUT VIC AB 3-0 CT1 TAPERPNT 27 (SUTURE) ×1 IMPLANT
TOWEL OR 17X24 6PK STRL BLUE (TOWEL DISPOSABLE) ×2 IMPLANT
WATER STERILE IRR 1000ML POUR (IV SOLUTION) ×1 IMPLANT

## 2024-03-04 NOTE — Progress Notes (Signed)
 [id number]-RN accompany patient (via WC) to NICU with her partner to visit her baby.

## 2024-03-04 NOTE — Discharge Instructions (Signed)
 Cesarean Delivery, Care After Refer to this sheet in the next few weeks. These instructions provide you with information on caring for yourself after your procedure. Your health care provider may also give you specific instructions. Your treatment has been planned according to current medical practices, but problems sometimes occur. Call your health care provider if you have any problems or questions after you go home. HOME CARE INSTRUCTIONS  Only take over-the-counter or prescription medications as directed by your health care provider. Do not drink alcohol, especially if you are breastfeeding or taking medication to relieve pain. Do not  smoke tobacco. Continue to use good perineal care. Good perineal care includes: Wiping your perineum from front to back. Keeping your perineum clean. Check your surgical cut (incision) daily for increased redness, drainage, swelling, or separation of skin. Shower and clean your incision gently with soap and water every day, by letting warm and soapy water run over the incision, and then pat it dry. If your health care provider says it is okay, leave the incision uncovered. Use a bandage (dressing) if the incision is draining fluid or appears irritated. If the adhesive strips across the incision do not fall off within 7 days, carefully peel them off, after a shower. Hug a pillow when coughing or sneezing until your incision is healed. This helps to relieve pain. Do not use tampons, douches or have sexual intercourse, until your health care provider says it is okay. Wear a well-fitting bra that provides breast support. Limit wearing support panties or control-top hose. Drink enough fluids to keep your urine clear or pale yellow. Eat high-fiber foods such as whole grain cereals and breads, brown rice, beans, and fresh fruits and vegetables every day. These foods may help prevent or relieve constipation. Resume activities such as climbing stairs, driving, lifting,  exercising, or traveling as directed by your health care provider. Try to have someone help you with your household activities and your newborn for at least a few days after you leave the hospital. Rest as much as possible. Try to rest or take a nap when your newborn is sleeping. Increase your activities gradually. Do not lift more than 15lbs until directed by a provider. Keep all of your scheduled postpartum appointments. It is very important to keep your scheduled follow-up appointments. At these appointments, your health care provider will be checking to make sure that you are healing physically and emotionally. SEEK MEDICAL CARE IF:  You are passing large clots from your vagina. Save any clots to show your health care provider. You have a foul smelling discharge from your vagina. You have trouble urinating. You are urinating frequently. You have pain when you urinate. You have a change in your bowel movements. You have increasing redness, pain, or swelling near your incision. You have pus draining from your incision. Your incision is separating. You have painful, hard, or reddened breasts. You have a severe headache. You have blurred vision or see spots. You feel sad or depressed. You have thoughts of hurting yourself or your newborn. You have questions about your care, the care of your newborn, or medications. You are dizzy or light-headed. You have a rash. You have pain, redness, or swelling at the site of the removed intravenous access (IV) tube. You have nausea or vomiting. You stopped breastfeeding and have not had a menstrual period within 12 weeks of stopping. You are not breastfeeding and have not had a menstrual period within 12 weeks of delivery. You have a fever. SEEK  IMMEDIATE MEDICAL CARE IF: You have persistent pain. You have chest pain. You have shortness of breath. You faint. You have leg pain. You have stomach pain. Your vaginal bleeding saturates 2 or more  sanitary pads in 1 hour. MAKE SURE YOU:  Understand these instructions. Will watch your condition. Will get help right away if you are not doing well or get worse. Document Released: 09/06/2002 Document Revised: 05/01/2014 Document Reviewed: 08/11/2012 [facility] Patient Information 2015 [place], [place]. This information is not intended to replace advice given to you by your health care provider. Make sure you discuss any questions you have with your health care provider.  Continue to check your blood pressures twice a day Call the office for blood pressures that are consistently above 145 for the top number or 95 for the bottom number   Hypertension During Pregnancy Hypertension is also called high blood pressure. High blood pressure means that the force of your blood moving in your body is too strong. It can cause problems for you and your baby. Different types of high blood pressure can happen during pregnancy. The types are: High blood pressure before you got pregnant. This is called chronic hypertension.  This can continue during your pregnancy. Your doctor will want to keep checking your blood pressure. You may need medicine to keep your blood pressure under control while you are pregnant. You will need follow-up visits after you have your baby. High blood pressure that goes up during pregnancy when it was normal before. This is called gestational hypertension. It will usually get better after you have your baby, but your doctor will need to watch your blood pressure to make sure that it is getting better. Very high blood pressure during pregnancy. This is called preeclampsia. Very high blood pressure is an emergency that needs to be checked and treated right away. You may develop very high blood pressure after giving birth. This is called postpartum preeclampsia. This usually occurs within 48 hours after childbirth but may occur up to 6 weeks after giving birth. This is rare. How does this  affect me? If you have high blood pressure during pregnancy, you have a higher chance of developing high blood pressure: As you get older. If you get pregnant again. In some cases, high blood pressure during pregnancy can cause: Stroke. Heart attack. Damage to the kidneys, lungs, or liver. Preeclampsia. Jerky movements you cannot control (convulsions or seizures). Problems with the placenta.  What can I do to lower my risk?  Keep a healthy weight. Eat a healthy diet. Follow what your doctor tells you about treating any medical problems that you had before becoming pregnant. It is very important to go to all of your doctor visits. Your doctor will check your blood pressure and make sure that your pregnancy is progressing as it should. Treatment should start early if a problem is found.  Follow these instructions at home:  Take your blood pressure 1-2 times per day. Call the office if your blood pressure is 155 or higher for the top number or 105 or higher for the bottom number.    Eating and drinking  Drink enough fluid to keep your pee (urine) pale yellow. Avoid caffeine. Lifestyle Do not use any products that contain nicotine or tobacco, such as cigarettes, e-cigarettes, and chewing tobacco. If you need help quitting, ask your doctor. Do not use alcohol or drugs. Avoid stress. Rest and get plenty of sleep. Regular exercise can help. Ask your doctor what kinds of  exercise are best for you. General instructions Take over-the-counter and prescription medicines only as told by your doctor. Keep all prenatal and follow-up visits as told by your doctor. This is important. Contact a doctor if: You have symptoms that your doctor told you to watch for, such as: Headaches. Nausea. Vomiting. Belly (abdominal) pain. Dizziness. Light-headedness. Get help right away if: You have: Very bad belly pain that does not get better with treatment. A very bad headache that does not get  better. Vomiting that does not get better. Sudden, fast weight gain. Sudden swelling in your hands, ankles, or face. Blood in your pee. Blurry vision. Double vision. Shortness of breath. Chest pain. Weakness on one side of your body. Trouble talking. Summary High blood pressure is also called hypertension. High blood pressure means that the force of your blood moving in your body is too strong. High blood pressure can cause problems for you and your baby. Keep all follow-up visits as told by your doctor. This is important. This information is not intended to replace advice given to you by your health care provider. Make sure you discuss any questions you have with your health care provider. Document Released: 01/17/2011 Document Revised: 04/07/2019 Document Reviewed: 01/11/2019 Elsevier Patient Education  2020 [employer].

## 2024-03-04 NOTE — Op Note (Addendum)
 Operative Note   SURGERY DATE: 03/04/2024  PRE-OP DIAGNOSIS:  *Pregnancy at 37/6 *Fetal intolerance of labor *Failed TOLAC IOL for severe pre-eclampsia superimposed on CHTN *Desire for long acting reversible contraception  POST-OP DIAGNOSIS: Delivered. Pelvic adhesive disease  PROCEDURE: CLASSICAL (high-mid transverse) repeat cesarean section via pfannenstiel skin incision with double layer uterine closure and Mirena IUD placement.   SURGEON: Surgeons and Role:    * [place] Bing, MD - Primary   ASSISTANT:    Wyn Forster, MD - Assisting  An experienced assistant was required given the standard of surgical care given the complexity of the case.  This assistant was needed for exposure, dissection, suctioning, retraction, instrument exchange, assisting with delivery with administration of fundal pressure, and for overall help during the procedure.  ANESTHESIA: epidural  ESTIMATED BLOOD LOSS:   DRAINS: UOP via indwelling foley  TOTAL IV FLUIDS: crystalloid  VTE PROPHYLAXIS: SCDs to bilateral lower extremities  ANTIBIOTICS: Two grams of Cefazolin were given within 1 hour of skin incision and azithromycin 500mg  IV x 1 given intraoperatively  SPECIMENS: placenta to pathology  COMPLICATIONS: pelvic adhesive disease causing the bladder to be adhesed high on the uterus along with lower uterine segment not being labored and a small baby/not very enlarged uterus necessitating need for a mid transverse/classical cesarean section.   FINDINGS: The bladder was densely adhesed to the lower uterine segment.  Grossly normal uterus and only left fallopian tube (normal) was definitively seen but no adnexal masses palpated bilaterally. Scant, clear amniotic fluid, cephalic, female infant, weight 1900gm, APGARs 9/9, intact placenta. Inability to draw arterial gas due to insufficient sample in cord Venous pH 7.33, A-B deficit 1.1, Bicarb 25.3, CO2 48  PROCEDURE IN DETAIL:  The patient was taken to the operating room where anesthesia was administered and normal fetal heart tones were confirmed. She was then prepped and draped in the normal fashion in the dorsal supine position with a leftward tilt.  After a time out was performed, a pfannensteil skin incision was made with the scalpel and carried through to the underlying layer of fascia. The fascia was then incised at the midline and this incision was extended laterally with the mayo scissors. Attention was turned to the superior aspect of the fascial incision which was grasped with the kocher clamps x 2, tented up and the rectus muscles were dissected off with the scalpel. In a similar fashion the inferior aspect of the fascial incision was grasped with the kocher clamps, tented up and the rectus muscles dissected off with the mayo scissors. The rectus muscles were then separated in the midline and the peritoneum was entered bluntly, and the vesicouterine peritoneum was identified and an Teacher, early years/pre placed  A low transverse hysterotomy was made with the scalpel until the endometrial cavity was breached and the amniotic sac ruptured, yielding scant, clear amniotic fluid but we had to go through the placenta. This incision was extended bluntly and the infant's head, shoulders and body were delivered atraumatically.The cord was immediately clamped x 2 and cut, and the infant was handed to the awaiting pediatricians, after delayed cord clamping was not done.  The placenta was then gradually expressed from the uterus and then the uterus was exteriorized and cleared of all clots and debris. The Mirena was then placed and the strings teased down in to the cervix.   The hysterotomy was repaired with a running suture of 1-0 monocryl. Several figure-of-eight sutures of 1-0 monocryl and 2-0 chromic, along  with bovie and Arista were added to achieve excellent hemostasis.    The uterus and adnexa were then returned to the abdomen, and  the hysterotomy and all operative sites were reinspected and excellent hemostasis was noted after irrigation and suction of the abdomen with warm saline.  There was no peritoneum to re-approximate. The fascia was reapproximated with 0 Vicryl in a simple running fashion, and the skin was then closed with 4-0 monocryl, in a subcuticular fashion.  The patient  tolerated the procedure well. Sponge, lap, needle, and instrument counts were correct x 2. The patient was transferred to the recovery room awake, alert and breathing independently in stable condition.  The patient was informed of the need for a repeat c-section approximately 3-4 weeks before her due date, for future pregnancies.   Cornelia Copa MD Attending Center for [facility])

## 2024-03-04 NOTE — Anesthesia Preprocedure Evaluation (Addendum)
 Anesthesia Evaluation  Patient identified by MRN, date of birth, ID band Patient awake    Reviewed: Allergy & Precautions, NPO status , Patient's Chart, lab work & pertinent test results  History of Anesthesia Complications Negative for: history of anesthetic complications  Airway Mallampati: II       Dental no notable dental hx.    Pulmonary former smoker   Pulmonary exam normal        Cardiovascular hypertension, Pt. on medications Normal cardiovascular exam     Neuro/Psych negative neurological ROS  negative psych ROS   GI/Hepatic negative GI ROS, Neg liver ROS,,,  Endo/Other  negative endocrine ROS    Renal/GU negative Renal ROS     Musculoskeletal   Abdominal   Peds  Hematology negative hematology ROS (+)   Anesthesia Other Findings   Reproductive/Obstetrics (+) Pregnancy                             Anesthesia Physical Anesthesia Plan  ASA: 2 and emergent  Anesthesia Plan: Epidural   Post-op Pain Management: Ofirmev IV (intra-op)*   Induction:   PONV Risk Score and Plan: 2 and Ondansetron, Dexamethasone and Treatment may vary due to age or medical condition  Airway Management Planned: Natural Airway  Additional Equipment:   Intra-op Plan:   Post-operative Plan:   Informed Consent: I have reviewed the patients History and Physical, chart, labs and discussed the procedure including the risks, benefits and alternatives for the proposed anesthesia with the patient or authorized representative who has indicated his/her understanding and acceptance.       Plan Discussed with: CRNA  Anesthesia Plan Comments: (Lab Results      Component                Value               Date                      WBC                      9.5                 03/03/2024                HGB                      9.2 (L)             03/03/2024                HCT                      27.1  (L)            03/03/2024                MCV                      76.1 (L)            03/03/2024                PLT                      280                 03/03/2024           )  Anesthesia Quick Evaluation

## 2024-03-04 NOTE — Discharge Summary (Signed)
 Postpartum Discharge Summary    Patient Name: Tamara Krause DOB: 19-Aug-1996 MRN: [id number] [facility]  Date of admission: 03/03/2024 Delivery date:03/04/2024 Delivering provider: [place] Bing Date of discharge: 03/07/2024  Admitting diagnosis: Pregnancy at 37/5. Severe pre-eclampsia (BPs) superimposed on CHTN. Scant prenatal care (three OB visits & and ultrasounds). History of c-section x 2, h/o VBAC x1 and desire for TOLAC. Size less than dates    Additional problems: Anemia.     Discharge diagnosis: Term Pregnancy Delivered and Preeclampsia (severe)                                              Augmentation: Pitocin and IP Foley Complications: Fetal intolerance of labor  Hospital course: Patient presented to clinic, for an ROB, and had severe range BPs, which were confirmed at the hospital. She elected for a TOLAC, had normal admit labs, was started on Mg and procardia; for her IOL she had a balloon and then pitocin. She got to 5cm but it was more of a "foley 5cm" and she had fetal intolerance with pitocin augmentation. Rpt c-section recommended and she was amenable to this. She had a CLASSICAL c-section due to high bladder adhesions, unlabored uterus and a small baby/uterus; EBL Details of operation can be found in separate operative Note.  Patient had an uncomplicated postpartum course. She is ambulating, tolerating a regular diet, passing flatus, and urinating well.  Patient is discharged home in stable condition on 03/07/24.      Newborn Data: Birth date:03/04/2024 Birth time:9:34 AM Gender:Female Living status:Living Apgars:9 ,9  Weight:1900 g                               Magnesium Sulfate received: Yes: Seizure prophylaxis  Immunizations administered: Immunization History  Administered Date(s) Administered   Influenza, Seasonal, Injecte, Preservative Fre 10/14/2023   Tdap 11/19/2021    Physical exam  Vitals:    03/06/24 1415 03/06/24 2019 03/06/24 2340 03/07/24 0750  BP:  (!) 143/81 136/85 (!) 148/88  Pulse:  76 63 69  Resp:  18 16 16   Temp:  98.1 F (36.7 C) 97.6 F (36.4 C) (!) 97.5 F (36.4 C)  TempSrc:  Oral Oral Oral  SpO2: 99% 100% 99% 100%  Weight:      Height:       General: alert, cooperative, and no distress Lochia: appropriate Uterine Fundus: firm Incision: Dressing is clean, dry, and intact DVT Evaluation: No evidence of DVT seen on physical exam. Labs: Lab Results  Component Value Date   WBC 10.6 (H) 03/05/2024   HGB 7.3 (L) 03/05/2024   HCT 20.7 (L) 03/05/2024   MCV 75.0 (L) 03/05/2024   PLT 236 03/05/2024      Latest Ref Rng & Units 03/05/2024    8:06 AM  CMP  Glucose 70 - 99 mg/dL 401   BUN 6 - 20 mg/dL <5   Creatinine 0.27 - 1.00 mg/dL 2.53   Sodium 664 - 403 mmol/L 134   Potassium 3.5 - 5.1 mmol/L 4.0   Chloride 98 - 111 mmol/L 101   CO2 22 - 32 mmol/L 23   Calcium 8.9 - 10.3 mg/dL 6.6   Total Protein 6.5 - 8.1 g/dL 5.6   Total Bilirubin 0.0 - 1.2  mg/dL 0.5   Alkaline Phos 38 - 126 U/L 79   AST 15 - 41 U/L 21   ALT 0 - 44 U/L 8    Edinburgh Score:    03/05/2024    9:00 AM  Edinburgh Postnatal Depression Scale Screening Tool  I have been able to laugh and see the funny side of things. 0  I have looked forward with enjoyment to things. 0  I have blamed myself unnecessarily when things went wrong. 0  I have been anxious or worried for no good reason. 2  I have felt scared or panicky for no good reason. 0  Things have been getting on top of me. 1  I have been so unhappy that I have had difficulty sleeping. 0  I have felt sad or miserable. 1  I have been so unhappy that I have been crying. 0  The thought of harming myself has occurred to me. 0  Edinburgh Postnatal Depression Scale Total 4      After visit meds:  Allergies as of 03/07/2024   No Known Allergies      Medication List     STOP taking these medications    acetaminophen 325 MG  tablet Commonly known as: TYLENOL   aspirin EC 81 MG tablet   doxylamine (Sleep) 25 MG tablet Commonly known as: UNISOM   metoCLOPramide 10 MG tablet Commonly known as: REGLAN   promethazine 25 MG tablet Commonly known as: PHENERGAN       TAKE these medications    Ferric Maltol 30 MG Caps Take 1 capsule (30 mg total) by mouth 2 (two) times daily. Please take one hour before breakfast and dinner   furosemide 20 MG tablet Commonly known as: LASIX Take 1 tablet (20 mg total) by mouth daily. Start taking on: March 08, 2024   ibuprofen 600 MG tablet Commonly known as: ADVIL Take 1 tablet (600 mg total) by mouth every 6 (six) hours.   NIFEdipine 60 MG 24 hr tablet Commonly known as: ADALAT CC Take 1 tablet (60 mg total) by mouth daily.   oxyCODONE-acetaminophen 5-325 MG tablet Commonly known as: PERCOCET/ROXICET Take 1 tablet by mouth every 6 (six) hours as needed.   potassium chloride SA 20 MEQ tablet Commonly known as: KLOR-CON M Take 1 tablet (20 mEq total) by mouth daily.   Prenatal 28-0.8 MG Tabs Take 1 tablet by mouth daily.         Discharge home in stable condition Infant Feeding: Bottle and Breast Infant Disposition:NICU Discharge instruction: per After Visit Summary and Postpartum booklet. Activity: Advance as tolerated. Pelvic rest for 6 weeks.  Diet: routine diet Anticipated Birth Control: PP IUD placed at the time of the cesarean section Postpartum Appointment:1 week for BP check Additional Postpartum F/U: Incision check 1 week Future Appointments: Future Appointments  Date Time Provider Department Center  03/11/2024 10:20 AM [facility] NURSE [facility]   Follow up Visit:  Follow-up Information     Center for [facility]. Go in 1 week(s).   Specialty: Obstetrics and Gynecology Why: Blood pressure and incision check appointment Contact information: [address] [place]  [address] [phone]                    03/07/2024 Catalina Antigua, MD

## 2024-03-04 NOTE — Anesthesia Procedure Notes (Signed)
 Epidural Patient location during procedure: OB Start time: 03/04/2024 5:28 AM End time: 03/04/2024 5:33 AM  Staffing Anesthesiologist: Shelton Silvas, MD Performed: anesthesiologist   Preanesthetic Checklist Completed: patient identified, IV checked, site marked, risks and benefits discussed, surgical consent, monitors and equipment checked, pre-op evaluation and timeout performed  Epidural Patient position: sitting Prep: DuraPrep Patient monitoring: heart rate, continuous pulse ox and blood pressure Approach: midline Location: L3-L4 Injection technique: LOR saline  Needle:  Needle type: Tuohy  Needle gauge: 17 G Needle length: 9 cm Catheter type: closed end flexible Catheter size: 20 Guage Test dose: negative and 1.5% lidocaine  Assessment Events: blood not aspirated, no cerebrospinal fluid, injection not painful, no injection resistance and no paresthesia  Additional Notes LOR @ 5.5  Patient identified. Risks/Benefits/Options discussed with patient including but not limited to bleeding, infection, nerve damage, paralysis, failed block, incomplete pain control, headache, blood pressure changes, nausea, vomiting, reactions to medications, itching and postpartum back pain. Confirmed with bedside nurse the patient's most recent platelet count. Confirmed with patient that they are not currently taking any anticoagulation, have any bleeding history or any family history of bleeding disorders. Patient expressed understanding and wished to proceed. All questions were answered. Sterile technique was used throughout the entire procedure. Please see nursing notes for vital signs. Test dose was given through epidural catheter and negative prior to continuing to dose epidural or start infusion. Warning signs of high block given to the patient including shortness of breath, tingling/numbness in hands, complete motor block, or any concerning symptoms with instructions to call for help. Patient was  given instructions on fall risk and not to get out of bed. All questions and concerns addressed with instructions to call with any issues or inadequate analgesia.    Reason for block:procedure for pain

## 2024-03-04 NOTE — Progress Notes (Addendum)
 L&D Note  03/04/2024 - 8:26 AM  28 y.o. [id number] [redacted]w[redacted]d. 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/2024   Late prenatal care affecting pregnancy in third trimester 01/05/2024   History of severe preeclampsia in last pregnancy 12/16/2023   Pregnancy with history of cesarean section, antepartum 11/25/2023   History of 2 cesarean sections 11/25/2023   Chronic hypertension during pregnancy, antepartum 11/25/2023   Marijuana use during pregnancy 11/25/2023   Obesity affecting pregnancy, antepartum 11/25/2023   Supervision of high risk pregnancy in second trimester 10/29/2023   Hyperemesis arising during pregnancy 10/13/2023    Tamara Krause is admitted for TOLAC IOL for severe pre-eclampsia (BPs) superimposed on CHTN   Subjective:  Comfortable with epidural. No s/s of severe pre-eclampsia or mg toxicity  Objective:   Vitals:   03/04/24 0605 03/04/24 0630 03/04/24 0700 03/04/24 0730  BP: (!) 130/59 (!) 123/48 (!) 111/49 129/65  Pulse: 70 68 63 76  Resp:   18 16  Temp:    97.6 F (36.4 C)  TempSrc:    Oral  SpO2:      Weight:      Height:        Current Vital Signs 24h Vital Sign Ranges  T 97.6 F (36.4 C) Temp  Avg: 97.7 F (36.5 C)  Min: 97.4 F (36.3 C)  Max: 98.1 F (36.7 C)  BP 129/65 BP  Min: 111/49  Max: 168/85  HR 76 Pulse  Avg: 83.3  Min: 63  Max: 121  RR 16 Resp  Avg: 17.8  Min: 16  Max: 18  SaO2 99 %   SpO2  Avg: 98.7 %  Min: 98 %  Max: 100 %       24 Hour I/O Current Shift I/O  Time Ins Outs 03/06 0701 - 03/07 0700 In: 1136.5 [P.O.:160; I.V.:976.5] Out: 500 [Urine:500] 03/07 0701 - 03/07 1900 In: -  Out: 300 [Urine:300]   FHR: 110 baseline, no accel,  occasional lates and slight variables, min to mod variability Toco: q23m Gen: NAD SVE: deferred. Checked approximately 45 minutes ago and 5cm  Labs:  Recent Labs  Lab 03/03/24 2218 03/04/24 0541 03/04/24 0803  WBC 9.5 10.4 8.3  HGB 9.2* 9.5* 8.9*  HCT 27.1* 27.4* 25.3*  PLT 280 293 243   Recent Labs  Lab 03/03/24 2218 03/04/24 0541  NA 135 132*  K 3.1* 3.3*  CL 101 101  CO2 20* 21*  BUN <5* <5*  CREATININE 0.70 0.57  CALCIUM 9.2 7.4*  PROT 7.0 6.4*  BILITOT 0.6 0.4  ALKPHOS 108 102  ALT 9 8  AST 18 14*  GLUCOSE 170* 104*    Medications Current Facility-Administered Medications  Medication Dose Route Frequency Provider Last Rate Last Admin   acetaminophen (TYLENOL) tablet 650 mg  650 mg Oral Q4H PRN Joanne Gavel, MD       diphenhydrAMINE (BENADRYL) injection 12.5 mg  12.5 mg Intravenous Q15 min PRN Shelton Silvas, MD       ePHEDrine injection 10 mg  10 mg Intravenous PRN Shelton Silvas, MD       ePHEDrine injection 10 mg  10 mg Intravenous PRN Shelton Silvas, MD  ePHEDrine injection 10 mg  10 mg Intravenous PRN Shelton Silvas, MD       ePHEDrine injection 10 mg  10 mg Intravenous PRN Shelton Silvas, MD       fentaNYL (SUBLIMAZE) injection 50-100 mcg  50-100 mcg Intravenous Q1H PRN Joanne Gavel, MD   50 mcg at 03/04/24 0002   fentaNYL 2 mcg/mL w/ bupivacaine 0.125% in NS 250 mL epidural infusion  12 mL/hr Epidural Continuous PRN Shelton Silvas, MD 12 mL/hr at 03/04/24 0533 12 mL/hr at 03/04/24 0533   fentaNYL 2 mcg/mL w/ bupivacaine 0.125% in NS 250 mL epidural infusion  12 mL/hr Epidural Continuous PRN Shelton Silvas, MD       hydrALAZINE (APRESOLINE) injection 5 mg  5 mg Intravenous PRN Joanne Gavel, MD       And   hydrALAZINE (APRESOLINE) injection 10 mg  10 mg Intravenous PRN Joanne Gavel, MD       And   labetalol (NORMODYNE) injection 20 mg  20 mg Intravenous PRN Joanne Gavel, MD       And   labetalol (NORMODYNE)  injection 40 mg  40 mg Intravenous PRN Joanne Gavel, MD       hydrOXYzine (ATARAX) tablet 50 mg  50 mg Oral Q6H PRN Joanne Gavel, MD       lactated ringers infusion 500 mL  500 mL Intravenous Once Shelton Silvas, MD       lactated ringers infusion 500-1,000 mL  500-1,000 mL Intravenous PRN Joanne Gavel, MD       lactated ringers infusion   Intravenous Continuous Joanne Gavel, MD       lactated ringers infusion   Intravenous Continuous Joanne Gavel, MD 10 mL/hr at 03/03/24 2319 New Bag at 03/03/24 2319   lidocaine (PF) (XYLOCAINE) 1 % injection 30 mL  30 mL Subcutaneous PRN Joanne Gavel, MD       magnesium sulfate 40 grams in SWI 1000 mL OB infusion  2 g/hr Intravenous Titrated Joanne Gavel, MD 50 mL/hr at 03/03/24 2340 2 g/hr at 03/03/24 2340   ondansetron (ZOFRAN) injection 4 mg  4 mg Intravenous Q6H PRN Joanne Gavel, MD       oxyCODONE-acetaminophen (PERCOCET/ROXICET) 5-325 MG per tablet 1 tablet  1 tablet Oral Q4H PRN Joanne Gavel, MD       oxyCODONE-acetaminophen (PERCOCET/ROXICET) 5-325 MG per tablet 2 tablet  2 tablet Oral Q4H PRN Joanne Gavel, MD       oxytocin (PITOCIN) IV BOLUS FROM BAG  333 mL Intravenous Once Joanne Gavel, MD       oxytocin (PITOCIN) IV infusion 30 units in NS 500 mL - Premix  2.5 Units/hr Intravenous Continuous Joanne Gavel, MD       oxytocin (PITOCIN) IV infusion 30 units in NS 500 mL - Premix  1-40 milli-units/min Intravenous Titrated Joanne Gavel, MD   Stopped at 03/04/24 0753   PHENYLephrine 80 mcg/ml in normal saline Adult IV Push Syringe (For Blood Pressure Support)  80-160 mcg Intravenous PRN Shelton Silvas, MD       PHENYLephrine 80 mcg/ml in normal saline Adult IV Push Syringe (For Blood Pressure Support)  80 mcg Intravenous PRN Shelton Silvas, MD       PHENYLephrine 80 mcg/ml in normal saline Adult IV Push Syringe (For Blood Pressure Support)  80 mcg Intravenous PRN Shelton Silvas,  MD  PHENYLephrine 80 mcg/ml in normal saline Adult IV Push Syringe (For Blood Pressure Support)  80 mcg Intravenous PRN Shelton Silvas, MD       sodium citrate-citric acid (ORACIT) solution 30 mL  30 mL Oral Q2H PRN Joanne Gavel, MD       terbutaline (BRETHINE) injection 0.25 mg  0.25 mg Subcutaneous Once PRN Joanne Gavel, MD       Facility-Administered Medications Ordered in Other Encounters  Medication Dose Route Frequency Provider Last Rate Last Admin   lidocaine-EPINEPHrine (XYLOCAINE W/EPI) 2 %-1:200000 (PF) injection   Epidural Anesthesia Intra-op Shelton Silvas, MD   3 mL at 03/04/24 0531    Assessment & Plan:  Patient stable *Pregnancy: category II tracing, stable *Labor: pitocin turned off recently by overnight team due to non reassuring fetal heart tones and improving EFM. I told her that I don't recommend continuing her TOLAC, given her history, I don't recommend continuing with her TOLAC, which she understands and is amenable with proceeding with repeat c-section. D/w her and patient would like IUD that helps her periods. Patient consented for LNG IUD; I told her it'll be either a [place] or [place]. OB limited u/s with no e/o previa or accreta.  Plan for ancef, azithro *chronic anemia: d/w her that, if needed, would she be okay with a blood transfusion and she is amenable to it.  *GBS: neg *Analgesia: epidural working well.   Cornelia Copa MD Attending Center for [facility])

## 2024-03-04 NOTE — Transfer of Care (Signed)
 Immediate Anesthesia Transfer of Care Note  Patient: Tamara Krause  Procedure(s) Performed: CESAREAN DELIVERY (Abdomen)  Patient Location: PACU  Anesthesia Type:Spinal  Level of Consciousness: awake, alert , and oriented  Airway & Oxygen Therapy: Patient Spontanous Breathing  Post-op Assessment: Report given to RN and Post -op Vital signs reviewed and stable  Post vital signs: Reviewed and stable  Last Vitals:  Vitals Value Taken Time  BP 132/78 03/04/24 1053  Temp    Pulse 71 03/04/24 1058  Resp 14 03/04/24 1058  SpO2 100 % 03/04/24 1058  Vitals shown include unfiled device data.  Last Pain:  Vitals:   03/04/24 0730  TempSrc: Oral  PainSc: 0-No pain         Complications: No notable events documented.

## 2024-03-04 NOTE — Progress Notes (Signed)
 LABOR PROGRESS NOTE  Patient Name: Tamara Krause, female   DOB: March 24, 1996, 28 y.o.  MRN: [id number]  Patient sleeping comfortably with epidural. Adriana Simas out ~0550, and SROM'd prior. Cervix 4/60/ballotable. Continue titrating pitocin. Babe with minimal to moderate variability consistent with mom being on magnesium. Rare, shallow variables. Cat II.  Normotensive to mild range pressures. Labs stable. Replete K PO.  Plan of care reviewed with Dr. Berton Lan.  Joanne Gavel, MD

## 2024-03-04 NOTE — Anesthesia Postprocedure Evaluation (Addendum)
 Anesthesia Post Note  Patient: Tamara Krause  Procedure(s) Performed: CESAREAN DELIVERY (Abdomen)     Patient location during evaluation: PACU Anesthesia Type: Epidural Level of consciousness: awake and alert Pain management: pain level controlled Vital Signs Assessment: post-procedure vital signs reviewed and stable Respiratory status: spontaneous breathing, nonlabored ventilation and respiratory function stable Cardiovascular status: blood pressure returned to baseline Postop Assessment: epidural receding, no apparent nausea or vomiting, no headache and no backache Anesthetic complications: no   No notable events documented.            Shanda Howells

## 2024-03-04 NOTE — Progress Notes (Signed)
 LABOR PROGRESS NOTE  Patient Name: Tamara Krause, female   DOB: Oct 24, 1996, 29 y.o.  MRN: [id number]  Patient now s/p mag bolus. BP remains moderate. PreE labs came back wnl. Patient amenable to balloon placement for IOL. Cervix 1/40/ballotable. Discussed R/B/A of FB placement, and verbal consent obtained. Patient did not tolerate initial attempt at placement; successful after fentanyl 50 mcg given IV. Placed Cook, and uterine balloon filled with 60 cc of saline. Mom and babe tolerated well. Babe with minimal to moderate variability after starting mag, overall reassuring. Will start pit 2x2 (up to 8) while balloon in place. AROM after balloon out.  Joanne Gavel, MD

## 2024-03-04 NOTE — Lactation Note (Addendum)
 This note was copied from a baby's chart.  NICU Lactation Consultation Note  Patient Name: Tamara Krause [id number] Date: 03/04/2024 Age:29 hours  Reason for consult: Initial assessment; NICU baby; Early term 33-38.6wks; Infant < 5lbs; Other (Comment) (GHTN)  SUBJECTIVE  LC met with P4 Mom of ET infant "Tamara Krause" in the NICU for being SGA.  Baby is on room air and receiving gavage feedings of DBM.  Mom headed to NICU to hold baby STS.  Mom has pumped once and obtained drops.  LC washed the pump parts, and placed in drying bin while Mom in NICU.  CDC guidelines handout placed on drying bin.  Mom states she has WIC and doesn't have a DEBP.  Encouraged STS and pumping consistently to support a full milk supply.  OBJECTIVE Infant data: Mother's Current Feeding Choice: Breast Milk and Donor Milk  O2 Device: Room Air  Infant feeding assessment IDFTS - Readiness: 2 IDFTS - Quality: 1   Maternal data: [id number] C-Section, Classical Current breast feeding challenges:: infant separation Does the patient have breastfeeding experience prior to this delivery?: Yes How long did the patient breastfeed?: 2 months with one of her babies Pumping frequency: Initiated double pumping at 6 hrs post partum, encouraged pumping every 3 hrs Pumped volume: 0 mL (drops) Flange Size: 21 Risk factor for low/delayed milk supply:: SGA infant in the NICU  WIC Program: Yes WIC Referral Sent?: Yes What county?: [place]  ASSESSMENT Infant:  Feeding Status: Scheduled 8-11-2-5 Feeding method: Bottle Nipple Type: Nfant Extra Slow Flow (gold)  Maternal: Milk volume: Normal  INTERVENTIONS/PLAN Interventions: Interventions: Skin to skin; Breast massage; Hand express; Breast feeding basics reviewed; DEBP; LC Services brochure; CDC Guidelines for Breast Pump Cleaning Tools: Pump; Flanges Pump Education: Setup, frequency, and cleaning; Milk Storage (handout on CDC guidelines provided)  Plan: Consult  Status: NICU follow-up NICU Follow-up type: New admission follow up   Tamara Krause 03/04/2024, 4:41 PM

## 2024-03-05 LAB — COMPREHENSIVE METABOLIC PANEL
ALT: 8 U/L (ref 0–44)
AST: 21 U/L (ref 15–41)
Albumin: 2.2 g/dL — ABNORMAL LOW (ref 3.5–5.0)
Alkaline Phosphatase: 79 U/L (ref 38–126)
Anion gap: 10 (ref 5–15)
BUN: <5 mg/dL — ABNORMAL LOW (ref 6–20)
CO2: 23 mmol/L (ref 22–32)
Calcium: 6.6 mg/dL — ABNORMAL LOW (ref 8.9–10.3)
Chloride: 101 mmol/L (ref 98–111)
Creatinine, Ser: 0.62 mg/dL (ref 0.44–1.00)
GFR, Estimated: >60 mL/min (ref >60)
Glucose, Bld: 102 mg/dL — ABNORMAL HIGH (ref 70–99)
Potassium: 4 mmol/L (ref 3.5–5.1)
Sodium: 134 mmol/L — ABNORMAL LOW (ref 135–145)
Total Bilirubin: 0.5 mg/dL (ref 0.0–1.2)
Total Protein: 5.6 g/dL — ABNORMAL LOW (ref 6.5–8.1)

## 2024-03-05 LAB — CBC
HCT: 20.7 % — ABNORMAL LOW (ref 36.0–46.0)
Hemoglobin: 7.3 g/dL — ABNORMAL LOW (ref 12.0–15.0)
MCH: 26.4 pg (ref 26.0–34.0)
MCHC: 35.3 g/dL (ref 30.0–36.0)
MCV: 75 fL — ABNORMAL LOW (ref 80.0–100.0)
Platelets: 236 10*3/uL (ref 150–400)
RBC: 2.76 MIL/uL — ABNORMAL LOW (ref 3.87–5.11)
RDW: 13.2 % (ref 11.5–15.5)
WBC: 10.6 10*3/uL — ABNORMAL HIGH (ref 4.0–10.5)
nRBC: 0 % (ref 0.0–0.2)

## 2024-03-05 MED ORDER — NIFEDIPINE ER OSMOTIC RELEASE 60 MG PO TB24
60.0000 mg | ORAL_TABLET | Freq: Every day | ORAL | Status: DC
Start: 2024-03-06 — End: 2024-03-07
  Administered 2024-03-06 – 2024-03-07 (×2): 60 mg via ORAL
  Filled 2024-03-05 (×2): qty 1

## 2024-03-05 MED ORDER — NIFEDIPINE ER OSMOTIC RELEASE 30 MG PO TB24
30.0000 mg | ORAL_TABLET | ORAL | Status: AC
  Administered 2024-03-05: 30 mg via ORAL

## 2024-03-05 NOTE — Plan of Care (Signed)
 Problem: Education: Goal: Knowledge of disease or condition will improve Outcome: Progressing Goal: Knowledge of the prescribed therapeutic regimen will improve Outcome: Progressing   Problem: Fluid Volume: Goal: Peripheral tissue perfusion will improve Outcome: Progressing   Problem: Clinical Measurements: Goal: Complications related to disease process, condition or treatment will be avoided or minimized Outcome: Progressing   Problem: Education: Goal: Knowledge of General Education information will improve Description: Including pain rating scale, medication(s)/side effects and non-pharmacologic comfort measures Outcome: Progressing   Problem: Health Behavior/Discharge Planning: Goal: Ability to manage health-related needs will improve Outcome: Progressing   Problem: Clinical Measurements: Goal: Ability to maintain clinical measurements within normal limits will improve Outcome: Progressing Goal: Will remain free from infection Outcome: Progressing Goal: Diagnostic test results will improve Outcome: Progressing Goal: Respiratory complications will improve Outcome: Progressing Goal: Cardiovascular complication will be avoided Outcome: Progressing   Problem: Activity: Goal: Risk for activity intolerance will decrease Outcome: Progressing   Problem: Nutrition: Goal: Adequate nutrition will be maintained Outcome: Progressing   Problem: Coping: Goal: Level of anxiety will decrease Outcome: Progressing   Problem: Elimination: Goal: Will not experience complications related to bowel motility Outcome: Progressing Goal: Will not experience complications related to urinary retention Outcome: Progressing   Problem: Pain Managment: Goal: General experience of comfort will improve and/or be controlled Outcome: Progressing   Problem: Safety: Goal: Ability to remain free from injury will improve Outcome: Progressing   Problem: Skin Integrity: Goal: Risk for impaired  skin integrity will decrease Outcome: Progressing   Problem: Education: Goal: Knowledge of Childbirth will improve Outcome: Progressing Goal: Ability to make informed decisions regarding treatment and plan of care will improve Outcome: Progressing Goal: Ability to state and carry out methods to decrease the pain will improve Outcome: Progressing Goal: Individualized Educational Video(s) Outcome: Progressing   Problem: Coping: Goal: Ability to verbalize concerns and feelings about labor and delivery will improve Outcome: Progressing   Problem: Life Cycle: Goal: Ability to make normal progression through stages of labor will improve Outcome: Progressing Goal: Ability to effectively push during vaginal delivery will improve Outcome: Progressing   Problem: Role Relationship: Goal: Will demonstrate positive interactions with the child Outcome: Progressing   Problem: Safety: Goal: Risk of complications during labor and delivery will decrease Outcome: Progressing   Problem: Pain Management: Goal: Relief or control of pain from uterine contractions will improve Outcome: Progressing   Problem: Education: Goal: Knowledge of the prescribed therapeutic regimen will improve Outcome: Progressing Goal: Understanding of sexual limitations or changes related to disease process or condition will improve Outcome: Progressing Goal: Individualized Educational Video(s) Outcome: Progressing   Problem: Self-Concept: Goal: Communication of feelings regarding changes in body function or appearance will improve Outcome: Progressing   Problem: Skin Integrity: Goal: Demonstration of wound healing without infection will improve Outcome: Progressing   Problem: Education: Goal: Knowledge of condition will improve Outcome: Progressing Goal: Individualized Educational Video(s) Outcome: Progressing Goal: Individualized Newborn Educational Video(s) Outcome: Progressing   Problem:  Activity: Goal: Will verbalize the importance of balancing activity with adequate rest periods Outcome: Progressing Goal: Ability to tolerate increased activity will improve Outcome: Progressing   Problem: Coping: Goal: Ability to identify and utilize available resources and services will improve Outcome: Progressing   Problem: Life Cycle: Goal: Chance of risk for complications during the postpartum period will decrease Outcome: Progressing   Problem: Role Relationship: Goal: Ability to demonstrate positive interaction with newborn will improve Outcome: Progressing   Problem: Skin Integrity: Goal: Demonstration of wound healing without infection  will improve Outcome: Progressing

## 2024-03-05 NOTE — Progress Notes (Signed)
 POSTPARTUM PROGRESS NOTE  POD #1  Subjective:  Charnette Younkin is a 28 y.o. [id number] s/p repeat high mid-transverse C/S at [redacted]w[redacted]d.  No acute events overnight. She reports she is doing well. She denies any problems with ambulating, voiding or po intake. Denies nausea or vomiting. She has not passed flatus. Pain is well controlled.  Lochia is normal.  Objective: Blood pressure 122/63, pulse (!) 57, temperature 97.8 F (36.6 C), temperature source Oral, resp. rate 16, height 5\' 4"  (1.626 m), weight 80.7 kg, last menstrual period 06/13/2023, SpO2 98%, unknown if currently breastfeeding.  Physical Exam:  General: alert, cooperative and no distress Chest: no respiratory distress, CTA bilaterally Heart:regular rate and rhythm Abdomen: soft, nontender, nondistended, hypoactive bowel sounds Uterine Fundus: firm, appropriately tender DVT Evaluation: No calf swelling or tenderness Extremities: trace edema Skin: warm, dry; incision clean/dry/intact w/ honeycomb dressing in place  Recent Labs    03/04/24 0803 03/04/24 1809  HGB 8.9* 8.6*  HCT 25.3* 25.0*    Assessment/Plan: Laverne Gitto is a 28 y.o. [id number] s/p high mid transverse c/s at [redacted]w[redacted]d for failed TOLAC, severe preeclampsia superimposed on CHTN.  POD#1 -  Contraception: mirena Feeding: breast  Mag to be discontinued at 10 AM Monitor BP Continue procardia, lasix and potassium  Continue routine postpartum care   LOS: 2 days   Mariel Aloe, Md Faculty Attending, [facility] [employer] 03/05/2024, 7:23 AM

## 2024-03-05 NOTE — Lactation Note (Signed)
 This note was copied from a baby's chart.  NICU Lactation Consultation Note  Patient Name: Tamara Krause [id number] Date: 03/05/2024 Age:30 hours  Reason for consult: Follow-up assessment; NICU baby; Early term 39-38.6wks; Infant < 5lbs; Other (Comment); Breastfeeding assistance; Mother's request; RN request (SGA, LPNC, THC (+))  SUBJECTIVE Visited with family of 24 93/34 weeks old NICU female; Tamara Krause is a P4 and experienced breastfeeding. She reported she's pumping and getting enough colostrum to collect, praised her for her efforts. Noticed that pumping hasn't been consistent, explained the importance of consistent pumping for the onset of lactogenesis II and the protect her supply. Assisted with the 11 am feeding but baby "Tamara Krause" barely woke up. Came back for the 2 pm feeding and this time she was wide awake and alert, Tamara Krause took her to the L side in cross cradle hold and she was able to latch but only with a few sucks, she kept coming off the breast. Once NS # 20 was placed, she was able to stay latched taking long breaks and engaging in short bursts of sucking patterns with a few audible swallows noted, Tamara Krause kept massaging her breasts and doing breast compressions. NNS was the predominant sucking pattern during this intermittent 10 minutes feeding. Let family know that they qualify for a [facility] loaner pump on discharge since there is no pump at home. Tamara Krause plans to buy one out of pocket, discussed recommendations for DEBPs.  OBJECTIVE Infant data: Mother's Current Feeding Choice: Breast Milk and Donor Milk  O2 Device: Room Air  Infant feeding assessment IDFTS - Readiness: 1 IDFTS - Quality: 2   Maternal data: [id number] C-Section, Classical Has patient been taught Hand Expression?: Yes Hand Expression Comments: lots of colostrum! :-) Significant Breast History:: (+) breast change during the pregnancy Current breast feeding challenges:: C/S, SGA, < 5 lbs, infant  separation Does the patient have breastfeeding experience prior to this delivery?: Yes How long did the patient breastfeed?: 2 months with one of her babies Pumping frequency: 3 times/24 hours Pumped volume: 15 mL Flange Size: 21 Risk factor for low/delayed milk supply:: NICU admission  WIC Program: Yes WIC Referral Sent?: Yes What county?: [place]  ASSESSMENT Infant: Latch: Repeated attempts needed to sustain latch, nipple held in mouth throughout feeding, stimulation needed to elicit sucking reflex. Audible Swallowing: A few with stimulation Type of Nipple: Everted at rest and after stimulation (short shafted) Comfort (Breast/Nipple): Soft / non-tender Hold (Positioning): Assistance needed to correctly position infant at breast and maintain latch. (minimal assistance needed) LATCH Score: 7  Feeding Status: Scheduled 8-11-2-5 Feeding method: Breast Nipple Type: Nfant Slow Flow (purple)  Maternal: Milk volume: Normal  INTERVENTIONS/PLAN Interventions: Interventions: Breast feeding basics reviewed; Assisted with latch; Skin to skin; Breast massage; Hand express; Breast compression; Adjust position; Support pillows; DEBP; Education Discharge Education: -- Recruitment consultant [facility] loaner since she's not active in the [facility] program per Stevphen Meuse, [facility] director) Tools: Nipple Dorris Carnes Pump Education: Setup, frequency, and cleaning; Milk Storage (handout on CDC guidelines provided) Nipple shield size: 20  Plan: STS whenever possible Massage and hand express both breasts Pump both breasts on initiate mode every 3 hours for 15 minutes at a time Continue taking baby "Tamara Krause" to breast on feeding cues around feeding times using NS # 20 and call for assistance PRN  FOB present. All questions and concerns answered, family to contact [facility] services PRN.  Consult Status: NICU follow-up NICU Follow-up type: Maternal D/C visit   Lailyn Appelbaum S  Loriel Diehl 03/05/2024, 2:13 PM

## 2024-03-06 ENCOUNTER — Other Ambulatory Visit: Payer: Self-pay

## 2024-03-06 MED ORDER — FERROUS SULFATE 325 (65 FE) MG PO TABS
325.0000 mg | ORAL_TABLET | ORAL | Status: DC
Start: 2024-03-06 — End: 2024-03-07
  Administered 2024-03-06: 325 mg via ORAL
  Filled 2024-03-06: qty 1

## 2024-03-06 NOTE — Clinical Social Work Maternal (Signed)
 CLINICAL SOCIAL WORK MATERNAL/CHILD NOTE   Patient Details  Name: Tamara Krause MRN: [id number] Date of Birth: 12/12/1996   Date:  Dec 20, 2024   Clinical Social Worker Initiating Note:  Tamara Krause, [place]            Date/Time: Initiated:  03/06/24/1418          Child's Name:  Tamara Krause    Biological Parents:  Mother, Father (FOB: Tamara Krause, DOB: 10/05/1997)    Need for Interpreter:  None    Reason for Referral:  Current Substance Use/Substance Use During Pregnancy      Address:  [address] [place] [address]    Phone number:  [phone] (home)   (Phone not currently on. See below.)   Additional phone number:  MOB's phone is currently off. MOB requests to be contacted on her sister-in-law's phone, Tamara Krause @ [phone]    Household Members/Support Persons (HM/SP):   Household Member/Support Person 1, Household Member/Support Person 2, Household Member/Support Person 3, Household Member/Support Person 4, Household Member/Support Person 5, Household Member/Support Person 6     HM/SP Name Relationship DOB or Age  HM/SP -1 Tamara Krause FOB 10-05-1997  HM/SP -2 Tamara Krause Son 03/02/2022  HM/SP -3 Tamara Krause 07/05/2020  HM/SP -4 Tamara Krause Son 11/29/2018  HM/SP -5 Tamara Krause   HM/SP -6  Sister-in-law's son 2  HM/SP -7     HM/SP -8         Natural Supports (not living in the home):  Extended Family    Professional Supports: Case Research officer, political party ([employer])    Employment: Unemployed    Type of Work:      Education:   (12th grade)    Homebound arranged:     Surveyor, quantity Resources:  Medicaid    Other Resources:   (WIC referral placed)    Cultural/Religious Considerations Which May Impact Care:  Per chart review, MOB identifies as Curator.   Strengths:  Ability to meet basic needs  , Home prepared for child      Psychotropic Medications:          Pediatrician:         Pediatrician List:    [facility]       Pediatrician Fax Number:     Risk Factors/Current Problems:  Substance Use      Cognitive State:  Linear Thinking  , Able to Concentrate  , Alert  , Goal Oriented      Mood/Affect:  Comfortable  , Relaxed  , Interested  , Calm      CSW Assessment: CSW was consulted due to maternal marijuana use during pregnancy. CSW met with MOB at infant's bedside to complete assessment. When CSW entered room, MOB was in the bathroom and entered room shortly after CSW walked in. FOB was observed standing by infant's isolette. CSW introduced self. MOB provided verbal consent to complete consult with FOB present. CSW explained reason for consult. MOB presented as calm, was agreeable to consult and remained engaged throughout encounter.    CSW inquired how MOB is feeling emotionally. MOB became understandably tearful discussing infant's NICU admission, sharing that none of her other children (3) have had to be in the NICU and expressed feelings of sadness around the idea of leaving the hospital with infant remaining inpatient. CSW provided emotional support and normalized and  validated MOB's feelings. CSW inquired about MOB's mental health history. MOB denied a history of mental health symptoms/diagnoses and denied a history of postpartum depression/anxiety. CSW encouraged MOB to prioritize self care. CSW assessed for safety. MOB denied current SI/HI.    CSW provided education regarding the baby blues period vs. perinatal mood disorders, discussed treatment and gave resources for mental health follow up if concerns arise.  CSW recommends self-evaluation during the postpartum time period using the New Mom Checklist from Postpartum Progress and encouraged MOB to contact a medical professional if symptoms are noted at any time.     MOB reports she has a pack n play, clothing, diapers, and  wipes for infant but is in need of a car seat. MOB reports her caseworker through [employer] may be able to assist with a car seat. CSW explained hospital car seat program and explained that a car seat can be provided if MOB has exhausted all other options given a car seat is in stock. CSW encouraged MOB to reach out to her caseworker through [employer] and inquire about a car seat as soon as possible. MOB reports that she can provide $30 cash for car seat if she is unable to get a car seat another way. MOB reports she has an upcoming appointment with [employer] for additional baby supplies, which her sister-in-law set up for her. MOB reports she is undecided on infant's follow up care and requested a pediatrician list, which CSW provided. CSW inquired about additional resource needs. MOB reports she does not have WIC or EBT benefits but expressed interest in both. CSW placed a [facility] referral and provided MOB with contact information for [facility] and instructions on how to apply for EBT benefits. MOB also expressed interest in food bank information, which CSW provided.   CSW and MOB discussed infant's NICU admission. CSW informed MOB about the NICU, what to expect, and resources/supports available while infant is admitted to the NICU. MOB reported that she feels updated about infant's care. CSW inquired about transportation barriers. MOB shares that her uncle may be able to provide transportation, otherwise she can use Medicaid transportation or a Lyft.   CSW informed MOB about hospital drug screen policy due to reported use of marijuana during pregnancy. CSW explained that infant's UDS resulted as positive for marijuana and a CPS report would be made. CSW explained infant's CDS would continue to be monitored. MOB expressed understanding. CSW inquired about substance use during pregnancy. MOB reports she smoked marijuana about 1x/week to help her eat and have an appetite, and to cope with feelings of  stress. MOB reports she last smoked marijuana about 1 month ago. MOB denied using other illicit substances during pregnancy. CSW inquired about prior CPS involvement. MOB reports a CPS report was made after the birth of her youngest son, August Saucer due to infant testing positive for marijuana at birth. MOB reports CPS completed a home visit and closed the case shortly after.    CSW provided review of Sudden Infant Death Syndrome (SIDS) precautions.     CSW placed call to [facility] Hours Department of Social Services CPS line and made CPS report to Tamara Krause due to infant's UDS resulting positive for marijuana.   CSW will continue to offer support and resources to family while infant remains in NICU.    CSW Plan/Description:  Sudden Infant Death Syndrome (SIDS) Education, Perinatal Mood and Anxiety Disorder (PMADs) Education, Hospital Drug Screen Policy Information, Child Protective

## 2024-03-06 NOTE — Lactation Note (Signed)
 This note was copied from a baby's chart.  NICU Lactation Consultation Note  Patient Name: Tamara Krause [id number] Date: 03/06/2024 Age:29 hours  Reason for consult: Follow-up assessment; NICU baby; Early term 31-38.6wks; Infant < 5lbs; Other (Comment); Mother's request; RN request; Breastfeeding assistance (SGA, LPNC, THC (+))  SUBJECTIVE Visited with family of 71 83/4 weeks old NICU female; Tamara Krause is a P4 and requested assistance for the 11 am feeding with baby "Tamara Krause". She has been pumping, her milk came in last night. Noticed that pumping hasn't been consistent, re-educated about the importance of consistent pumping to protect her supply. She has been trying latching baby at the bare breast but she won't latch, NS # 20 given yesterday wasn't in the room anymore, LC went to get another one and this time "Tamara Krause" briefly latch to the L side in cross cradle hold, did a few sucks and then fell asleep. Tamara Krause voiced some dryness/sensitivity on her R nipple (see maternal assessment). Reviewed prevention and treatment for sore nipples.  OBJECTIVE Infant data: Mother's Current Feeding Choice: Breast Milk and Donor Milk  O2 Device: Room Air  Infant feeding assessment IDFTS - Readiness: 2 IDFTS - Quality: 3   Maternal data: [name] C-Section, Classical Has patient been taught Hand Expression?: Yes Hand Expression Comments: lots of colostrum! :-) Significant Breast History:: (+) breast change during the pregnancy Current breast feeding challenges:: C/S, SGA, < 5 lbs, infant separation Pumping frequency: 2 times/24 hours Pumped volume: 55 mL Risk factor for low/delayed milk supply:: NICU admission  WIC Program: Yes WIC Referral Sent?: Yes What county?: [place]  ASSESSMENT Infant: Latch: Repeated attempts needed to sustain latch, nipple held in mouth throughout feeding, stimulation needed to elicit sucking reflex. Audible Swallowing: None Type of Nipple: Everted at rest and  after stimulation Comfort (Breast/Nipple): Soft / non-tender Hold (Positioning): Assistance needed to correctly position infant at breast and maintain latch. (minimal assistance needed) LATCH Score: 6  Feeding Status: Scheduled 8-11-2-5 Feeding method: Breast Nipple Type: Tamara Krause Slow Flow (purple)  Maternal: Milk volume: Normal Areolar skin around R nipple is dry, not flaky but looks drier that the surrounding skin. Other than that, nipples and areolas are intact  INTERVENTIONS/PLAN Interventions: Interventions: Breast feeding basics reviewed; Assisted with latch; Skin to skin; Breast massage; Hand express; Breast compression; Adjust position; Support pillows; Coconut oil; DEBP; Education Discharge Education: -- Recruitment consultant [facility] loaner since she's not active in the [facility] program per Tamara Krause, [facility] director) Tools: Nipple Tamara Krause Nipple shield size: 20  Plan: STS whenever possible Massage and hand express both breasts Pump both breasts on maintain mode every 3 hours for 15 minutes at a time Continue taking baby "Tamara Krause" to breast on feeding cues around feeding times using NS # 20 and call for assistance PRN   FOB present. All questions and concerns answered, family to contact [facility] services PRN.  Consult Status: NICU follow-up NICU Follow-up type: Maternal D/C visit   Tamara Krause Tamara Krause 03/06/2024, 11:34 AM

## 2024-03-06 NOTE — Progress Notes (Signed)
 Subjective: Postpartum Day 2: Cesarean Delivery Patient reports incisional pain, tolerating PO, and no problems voiding.    Objective: Vital signs in last 24 hours: Temp:  [97.3 F (36.3 C)-97.9 F (36.6 C)] 97.6 F (36.4 C) (03/09 0746) Pulse Rate:  [69-91] 73 (03/09 0746) Resp:  [16-19] 16 (03/09 0746) BP: (123-142)/(63-79) 136/79 (03/09 0746) SpO2:  [99 %-100 %] 99 % (03/09 0746)  Physical Exam:  General: alert, cooperative, and no distress Lochia: appropriate Uterine Fundus: firm Incision: healing well DVT Evaluation: No evidence of DVT seen on physical exam.  Recent Labs    03/04/24 1809 03/05/24 0806  HGB 8.6* 7.3*  HCT 25.0* 20.7*    Assessment/Plan: Status post Cesarean section. Postoperative course complicated by anemia   Iron supplement ordered.  Scheryl Darter, MD 03/06/2024, 1:17 PM

## 2024-03-06 NOTE — Plan of Care (Signed)
 Problem: Education: Goal: Knowledge of disease or condition will improve Outcome: Progressing Goal: Knowledge of the prescribed therapeutic regimen will improve Outcome: Progressing   Problem: Fluid Volume: Goal: Peripheral tissue perfusion will improve Outcome: Progressing   Problem: Clinical Measurements: Goal: Complications related to disease process, condition or treatment will be avoided or minimized Outcome: Progressing   Problem: Education: Goal: Knowledge of General Education information will improve Description: Including pain rating scale, medication(s)/side effects and non-pharmacologic comfort measures Outcome: Progressing   Problem: Health Behavior/Discharge Planning: Goal: Ability to manage health-related needs will improve Outcome: Progressing   Problem: Clinical Measurements: Goal: Ability to maintain clinical measurements within normal limits will improve Outcome: Progressing Goal: Will remain free from infection Outcome: Progressing Goal: Diagnostic test results will improve Outcome: Progressing Goal: Respiratory complications will improve Outcome: Progressing Goal: Cardiovascular complication will be avoided Outcome: Progressing   Problem: Activity: Goal: Risk for activity intolerance will decrease Outcome: Progressing   Problem: Nutrition: Goal: Adequate nutrition will be maintained Outcome: Progressing   Problem: Coping: Goal: Level of anxiety will decrease Outcome: Progressing   Problem: Elimination: Goal: Will not experience complications related to bowel motility Outcome: Progressing Goal: Will not experience complications related to urinary retention Outcome: Progressing   Problem: Pain Managment: Goal: General experience of comfort will improve and/or be controlled Outcome: Progressing   Problem: Safety: Goal: Ability to remain free from injury will improve Outcome: Progressing   Problem: Skin Integrity: Goal: Risk for impaired  skin integrity will decrease Outcome: Progressing   Problem: Education: Goal: Knowledge of Childbirth will improve Outcome: Progressing Goal: Ability to make informed decisions regarding treatment and plan of care will improve Outcome: Progressing Goal: Ability to state and carry out methods to decrease the pain will improve Outcome: Progressing Goal: Individualized Educational Video(s) Outcome: Progressing   Problem: Coping: Goal: Ability to verbalize concerns and feelings about labor and delivery will improve Outcome: Progressing   Problem: Life Cycle: Goal: Ability to make normal progression through stages of labor will improve Outcome: Progressing Goal: Ability to effectively push during vaginal delivery will improve Outcome: Progressing   Problem: Role Relationship: Goal: Will demonstrate positive interactions with the child Outcome: Progressing   Problem: Safety: Goal: Risk of complications during labor and delivery will decrease Outcome: Progressing   Problem: Pain Management: Goal: Relief or control of pain from uterine contractions will improve Outcome: Progressing   Problem: Education: Goal: Knowledge of the prescribed therapeutic regimen will improve Outcome: Progressing Goal: Understanding of sexual limitations or changes related to disease process or condition will improve Outcome: Progressing Goal: Individualized Educational Video(s) Outcome: Progressing   Problem: Self-Concept: Goal: Communication of feelings regarding changes in body function or appearance will improve Outcome: Progressing   Problem: Skin Integrity: Goal: Demonstration of wound healing without infection will improve Outcome: Progressing   Problem: Education: Goal: Knowledge of condition will improve Outcome: Progressing Goal: Individualized Educational Video(s) Outcome: Progressing Goal: Individualized Newborn Educational Video(s) Outcome: Progressing   Problem:  Activity: Goal: Will verbalize the importance of balancing activity with adequate rest periods Outcome: Progressing Goal: Ability to tolerate increased activity will improve Outcome: Progressing   Problem: Coping: Goal: Ability to identify and utilize available resources and services will improve Outcome: Progressing   Problem: Life Cycle: Goal: Chance of risk for complications during the postpartum period will decrease Outcome: Progressing   Problem: Role Relationship: Goal: Ability to demonstrate positive interaction with newborn will improve Outcome: Progressing   Problem: Skin Integrity: Goal: Demonstration of wound healing without infection  will improve Outcome: Progressing

## 2024-03-07 ENCOUNTER — Other Ambulatory Visit (HOSPITAL_COMMUNITY): Payer: Self-pay

## 2024-03-07 LAB — TYPE AND SCREEN
ABO/RH(D): O POS
Antibody Screen: NEGATIVE
Unit division: 0

## 2024-03-07 LAB — BPAM RBC
Blood Product Expiration Date: 202504042359
Unit Type and Rh: 5100

## 2024-03-07 MED ORDER — IBUPROFEN 600 MG PO TABS
600.0000 mg | ORAL_TABLET | Freq: Four times a day (QID) | ORAL | 0 refills | Status: AC
  Filled 2024-03-07: qty 30, 8d supply, fill #0

## 2024-03-07 MED ORDER — NIFEDIPINE ER 60 MG PO TB24
60.0000 mg | ORAL_TABLET | Freq: Every day | ORAL | 1 refills | Status: AC
  Filled 2024-03-07: qty 30, 30d supply, fill #0

## 2024-03-07 MED ORDER — OXYCODONE-ACETAMINOPHEN 5-325 MG PO TABS
1.0000 | ORAL_TABLET | Freq: Four times a day (QID) | ORAL | 0 refills | Status: AC | PRN
  Filled 2024-03-07: qty 20, 5d supply, fill #0

## 2024-03-07 MED ORDER — POTASSIUM CHLORIDE CRYS ER 20 MEQ PO TBCR
20.0000 meq | EXTENDED_RELEASE_TABLET | Freq: Every day | ORAL | 0 refills | Status: AC
  Filled 2024-03-07: qty 5, 5d supply, fill #0

## 2024-03-07 MED ORDER — FUROSEMIDE 20 MG PO TABS
20.0000 mg | ORAL_TABLET | Freq: Every day | ORAL | 0 refills | Status: AC
  Filled 2024-03-07: qty 5, 5d supply, fill #0

## 2024-03-07 NOTE — Lactation Note (Signed)
 This note was copied from a baby's chart.  NICU Lactation Consultation Note  Patient Name: Tamara Krause [id number] Date: 03/07/2024 Age:29 hours  Reason for consult: Follow-up assessment; NICU baby; Early term 82-38.6wks; Infant < 5lbs; Maternal discharge; Other (Comment) (SGA, LPNC, THC (+))  SUBJECTIVE Visited with family of 28 1/61 weeks old AGA NICU female; Tamara Krause is a P4 and reported she's pumping closer to the 3 hour schedule, her supply continues to increase, praised her for her efforts. Parents have been working mainly on bottle feedings the last 24 hours, but she's still planning on taking her to breast when visiting. She voiced she had a "lump" on her axilla (see maternal assessment) she said it goes down when she pumps. Tamara Krause is getting discharged today. Reviewed discharge education and the importance of consistent pumping for the prevention of engorgement and to protect her supply. The [facility] office has already called her to schedule an appt, she'll be taking the hand pump home, declined loaner pump.   OBJECTIVE Infant data: Mother's Current Feeding Choice: Breast Milk and Donor Milk  O2 Device: Room Air  Infant feeding assessment IDFTS - Readiness: 2 IDFTS - Quality: 4   Maternal data: [id number] C-Section, Classical Pumping frequency: 4-6 times/24 hours Pumped volume: 120 mL  WIC Program: Yes WIC Referral Sent?: Yes What county?: [place] Pump: Manual (Declined WIC loaner pump)  ASSESSMENT Infant: Feeding Status: Scheduled 8-11-2-5 Feeding method: Bottle; Tube/Gavage (Bolus) Nipple Type: Nfant Slow Flow (purple)  Maternal: Milk volume: Abundant Noticed a node about the size of a golf ball on L axilla, findings are consistent with accessory glandular tissue on tail of Spence. Since pumping relieves swelling, accessory tissue is most likely connected to the main ducts system. No S/S of engorgement at this time, node on axillar area is soft and not tender to  touch but tender to pressure (TTP).  INTERVENTIONS/PLAN Interventions: Interventions: Breast feeding basics reviewed; DEBP; Education; Coconut oil Discharge Education: Engorgement and breast care Tools: 2F feeding tube / Syringe Nipple shield size: 20  Plan: STS whenever possible Pump both breasts on maintain mode every 3 hours for 30 minutes at a time Take all pump pieces to baby's room after her discharge Continue taking baby "Tamara Krause" to breast on feeding cues around feeding times using NS # 20 and call for assistance PRN Ice node on axilla PRN   FOB present. All questions and concerns answered, family to contact [facility] services PRN.  Consult Status: NICU follow-up NICU Follow-up type: Verify absence of engorgement; Weekly NICU follow up   Tamara Krause S Tamara Krause 03/07/2024, 12:06 PM

## 2024-03-08 ENCOUNTER — Encounter: Admitting: Obstetrics and Gynecology

## 2024-03-08 LAB — SURGICAL PATHOLOGY

## 2024-03-11 ENCOUNTER — Ambulatory Visit

## 2024-03-11 ENCOUNTER — Ambulatory Visit (HOSPITAL_COMMUNITY): Payer: Self-pay

## 2024-03-11 NOTE — Lactation Note (Signed)
 This note was copied from a baby's chart.  NICU Lactation Consultation Note  Patient Name: Tamara Krause [id number] Date: 03/11/2024 Age:29 days  Reason for consult: Follow-up assessment; NICU baby; Early term 73-38.6wks; Infant < 5lbs  SUBJECTIVE  LC in to visit with P4 Mom of ET infant "Tamara Krause" in the NICU.  Mom pace bottle feeding baby, she is taking 33% PO.  Mom is taking baby to the breast when baby is actively cueing.  Mom given another 20 mm NS as she states she left it at home. Mom knows she can ask for lactation assistance at a feeding for help with breastfeeding.  Mom provided with another pumping band.  Mom expressing > 4oz each breast.  Mom still using a hand pump at home.  Reviewed importance of frequent pumping every 3 hrs to support a full milk supply.  Mom educated about how initially milk supply can be more than baby needs, but eventually her supply will reduce if she isn't pumping/feeding often enough.  Mom provided with larger pumping bottles. Mom still hasn't signed up for [facility].  LC emailed Melanee Left [facility] director regarding Mom getting active on [facility] during baby's stay in NICU.  OBJECTIVE Infant data: No data recorded O2 Device: Room Air  Infant feeding assessment IDFTS - Readiness: 2 IDFTS - Quality: 3   Maternal data: [id number] C-Section, Classical Pumping frequency: 4-6 times per 24 hrs Pumped volume: 240 mL Flange Size: 21  WIC Program: Yes WIC Referral Sent?: Yes What county?: [place] Pump: Manual  ASSESSMENT Infant: Feeding Status: Scheduled 8-11-2-5 Feeding method: Bottle; Tube/Gavage (Bolus) Nipple Type: Dr. Levert Feinstein Preemie  Maternal: Milk volume: Abundant  INTERVENTIONS/PLAN Interventions: Interventions: Breast feeding basics reviewed; Skin to skin; Breast massage; Hand express; DEBP; Expressed milk; Hand pump Tools: Pump; Flanges; Bottle; Nipple Shields; 8 oz. bottles Pump Education: Setup, frequency, and cleaning; Milk  Storage Nipple shield size: 20  Plan: Consult Status: NICU follow-up NICU Follow-up type: Weekly NICU follow up   Tamara Krause 03/11/2024, 12:04 PM

## 2024-03-15 ENCOUNTER — Telehealth (HOSPITAL_COMMUNITY): Payer: Self-pay | Admitting: *Deleted

## 2024-03-15 ENCOUNTER — Ambulatory Visit: Payer: Medicaid Other

## 2024-03-15 ENCOUNTER — Other Ambulatory Visit: Payer: Medicaid Other

## 2024-03-15 NOTE — Telephone Encounter (Signed)
 03/15/2024  Name: Tamara Krause MRN: [id number] DOB: [birth year]  Reason for Call:  Transition of Care Hospital Discharge Call  Contact Status: Patient Contact Status: Unable to contact ("the number you have dialed is not is service")  Language assistant needed:          Follow-Up Questions:    Inocente Salles Postnatal Depression Scale:  In the Past 7 Days:    PHQ2-9 Depression Scale:     Discharge Follow-up:    Post-discharge interventions: NA  Salena Saner, RN 03/15/2024 15:01

## 2024-03-21 ENCOUNTER — Ambulatory Visit (HOSPITAL_COMMUNITY): Payer: Self-pay

## 2024-03-21 NOTE — Lactation Note (Signed)
 This note was copied from a baby's chart.  NICU Lactation Consultation Note  Patient Name: Tamara Krause [id number] Date: 03/21/2024 Age:29 wk.o.  Reason for consult: Follow-up assessment; NICU baby; Infant < 6lbs  SUBJECTIVE  LC in to visit with P4 Mom of baby "Tamara Krause" on day of baby's discharge from the NICU.  Baby has been ad lib bottle feeding EBM for 2 days and has gained weight appropriately.   Mom is still using a hand pump at home as she stated she missed her [facility] appointment.  Her milk supply has decreased due to using a hand pump and not pumping consistently.  Mom states she plans to go to [facility] on her way home to pick up her pump, but LC encouraged her to call WIC first.  Mom has been offered a [facility] loaner pump many times.  LC talked about getting the DEBP from [facility] and pumping regularly to build her supply up while breastfeeding Tamara Krause, but OP lactation support would be critically important.  LC talked about OP lactation f/u at the [facility].  Mom is interested, [facility] sent message.   Mom was provided with another Lactation handout with phone numbers for OP support.  OBJECTIVE Infant data: No data recorded O2 Device: Room Air  Infant feeding assessment IDFTS - Readiness: 2 (eyes closed but actively sucking pacifer upon entering the room) IDFTS - Quality: 2   Maternal data: [id number] C-Section, Classical Pumping frequency: 6 times per 24 hrs, one breast at a time Pumped volume: 45 mL Flange Size: 21 Hands-free pumping top sizes: Small/Medium (Blue)  WIC Program: Yes WIC Referral Sent?: Yes What county?: [place] Pump: Manual  ASSESSMENT Infant:  Feeding Status: Ad lib Feeding method: Bottle Nipple Type: Dr. Levert Feinstein Preemie  Maternal: Milk volume: Low  INTERVENTIONS/PLAN Interventions: Interventions: Breast feeding basics reviewed; Skin to skin; Breast massage; Hand express; Hand pump; DEBP; Education; [facility] Services brochure Tools: Pump; Flanges;  Hands-free pumping top  Plan: Consult Status: Complete NICU Follow-up type: Baby's discharge   [name] 03/21/2024, 10:20 AM
